# Patient Record
Sex: Male | Born: 1970 | Race: White | Hispanic: No | Marital: Married | Smoking: Never smoker
Health system: Southern US, Community
[De-identification: ages and names within clinical notes are randomized; demographics above are authoritative.]

## PROBLEM LIST (undated history)

## (undated) DIAGNOSIS — T7840XA Allergy, unspecified, initial encounter: Secondary | ICD-10-CM

## (undated) DIAGNOSIS — M5126 Other intervertebral disc displacement, lumbar region: Secondary | ICD-10-CM

## (undated) DIAGNOSIS — M51369 Other intervertebral disc degeneration, lumbar region without mention of lumbar back pain or lower extremity pain: Secondary | ICD-10-CM

## (undated) DIAGNOSIS — E785 Hyperlipidemia, unspecified: Secondary | ICD-10-CM

## (undated) DIAGNOSIS — K5790 Diverticulosis of intestine, part unspecified, without perforation or abscess without bleeding: Secondary | ICD-10-CM

## (undated) DIAGNOSIS — K631 Perforation of intestine (nontraumatic): Secondary | ICD-10-CM

## (undated) DIAGNOSIS — M5136 Other intervertebral disc degeneration, lumbar region: Secondary | ICD-10-CM

## (undated) DIAGNOSIS — K219 Gastro-esophageal reflux disease without esophagitis: Secondary | ICD-10-CM

## (undated) DIAGNOSIS — K529 Noninfective gastroenteritis and colitis, unspecified: Secondary | ICD-10-CM

## (undated) DIAGNOSIS — I10 Essential (primary) hypertension: Secondary | ICD-10-CM

## (undated) DIAGNOSIS — I493 Ventricular premature depolarization: Secondary | ICD-10-CM

## (undated) DIAGNOSIS — IMO0001 Reserved for inherently not codable concepts without codable children: Secondary | ICD-10-CM

## (undated) HISTORY — DX: Ventricular premature depolarization: I49.3

## (undated) HISTORY — DX: Allergy, unspecified, initial encounter: T78.40XA

## (undated) HISTORY — PX: COLONOSCOPY: SHX174

## (undated) HISTORY — DX: Perforation of intestine (nontraumatic): K63.1

## (undated) HISTORY — PX: WISDOM TOOTH EXTRACTION: SHX21

## (undated) HISTORY — DX: Diverticulosis of intestine, part unspecified, without perforation or abscess without bleeding: K57.90

## (undated) HISTORY — DX: Reserved for inherently not codable concepts without codable children: IMO0001

## (undated) HISTORY — DX: Noninfective gastroenteritis and colitis, unspecified: K52.9

## (undated) HISTORY — PX: MANDIBLE FRACTURE SURGERY: SHX706

## (undated) HISTORY — PX: LAPAROSCOPIC ILEOCECECTOMY: SHX5898

## (undated) HISTORY — DX: Gastro-esophageal reflux disease without esophagitis: K21.9

---

## 2015-08-12 ENCOUNTER — Encounter: Payer: Self-pay | Admitting: Physician Assistant

## 2015-08-12 ENCOUNTER — Ambulatory Visit: Payer: Self-pay | Admitting: Family

## 2015-08-12 VITALS — BP 144/110 | Temp 98.3°F

## 2015-08-12 DIAGNOSIS — M5440 Lumbago with sciatica, unspecified side: Secondary | ICD-10-CM

## 2015-08-12 MED ORDER — TRAMADOL HCL 50 MG PO TABS: 50.0000 mg | ORAL_TABLET | Freq: Three times a day (TID) | ORAL | Status: DC | PRN

## 2015-08-12 MED ORDER — PREDNISONE 10 MG (21) PO TBPK: 10.0000 mg | ORAL_TABLET | Freq: Three times a day (TID) | ORAL | Status: AC

## 2015-08-12 NOTE — Progress Notes (Signed)
S /43 y/o WM with prior h/o  disk issues in lumbar area , treated conservatively  And he has been asx until a few days ago he was unloading bails of mulch and with twisting motion noted acute pain  With radiation in to his left buttock and upper thigh . He denies B& B sxs no weakness or numbness. He has continued to work   O/ BP is elevated . He states it usually runs 110 / 70 and feels it is due to his pain.       Alert ,pleasant NAD   gait normal  Back full ROM , nontender to palpation, strength and reflexes are normal SLR neg  A/ LBP with sciatica  HTN    P / Instructed to hold taking voltaren and rx given to  begin Sterapred pack as directed x 12 days. Marland Kitchen He may use flexeril he has on hand, topical heat or ice .rx given for tramadol 50 mg one q 8 hours prn #20 0 rf.  He will monitor his BP and has a f/u appt with his PCP next week. Marland Kitchen

## 2015-10-05 ENCOUNTER — Encounter: Payer: Self-pay | Admitting: Physician Assistant

## 2015-10-05 ENCOUNTER — Ambulatory Visit: Payer: Self-pay | Admitting: Physician Assistant

## 2015-10-05 VITALS — BP 138/100 | HR 116 | Temp 98.0°F

## 2015-10-05 DIAGNOSIS — J069 Acute upper respiratory infection, unspecified: Secondary | ICD-10-CM

## 2015-10-05 MED ORDER — CEFDINIR 300 MG PO CAPS: 300.0000 mg | ORAL_CAPSULE | Freq: Two times a day (BID) | ORAL | Status: DC

## 2015-10-05 MED ORDER — BENZONATATE 200 MG PO CAPS: 200.0000 mg | ORAL_CAPSULE | Freq: Three times a day (TID) | ORAL | Status: DC | PRN

## 2015-10-05 NOTE — Progress Notes (Signed)
S: C/o runny nose and congestion for 3 days, no fever, chills, cp/sob, v/d; mucus was green this am but clear throughout the day, cough is sporadic, keeping him awake at night  Using otc meds: robitussin  O: PE: perrl eomi, normocephalic, tms dull, nasal mucosa red and swollen, throat injected, neck supple no lymph, lungs c t a, cv rrr, neuro intact  A:  Acute  uri   P:omnicef 300mg  bid, tessalon perls, phenergan dm with codeine 5ml q 6 prn cough, (pt to use only at night) 150ml nr;  drink fluids, continue regular meds , use otc meds of choice, return if not improving in 5 days, return earlier if worsening

## 2015-10-10 ENCOUNTER — Observation Stay (HOSPITAL_COMMUNITY)
Admission: EM | Admit: 2015-10-10 | Discharge: 2015-10-11 | Disposition: A | Discharge status: Home / Self Care | Payer: BLUE CROSS/BLUE SHIELD | Attending: Internal Medicine | Admitting: Internal Medicine

## 2015-10-10 ENCOUNTER — Emergency Department (HOSPITAL_COMMUNITY): Payer: BLUE CROSS/BLUE SHIELD

## 2015-10-10 ENCOUNTER — Encounter (HOSPITAL_COMMUNITY): Payer: Self-pay | Admitting: *Deleted

## 2015-10-10 DIAGNOSIS — K219 Gastro-esophageal reflux disease without esophagitis: Secondary | ICD-10-CM | POA: Insufficient documentation

## 2015-10-10 DIAGNOSIS — R002 Palpitations: Principal | ICD-10-CM | POA: Insufficient documentation

## 2015-10-10 DIAGNOSIS — Z880 Allergy status to penicillin: Secondary | ICD-10-CM | POA: Insufficient documentation

## 2015-10-10 DIAGNOSIS — M5126 Other intervertebral disc displacement, lumbar region: Secondary | ICD-10-CM | POA: Diagnosis not present

## 2015-10-10 DIAGNOSIS — E785 Hyperlipidemia, unspecified: Secondary | ICD-10-CM | POA: Diagnosis not present

## 2015-10-10 DIAGNOSIS — Z791 Long term (current) use of non-steroidal anti-inflammatories (NSAID): Secondary | ICD-10-CM | POA: Diagnosis not present

## 2015-10-10 DIAGNOSIS — I10 Essential (primary) hypertension: Secondary | ICD-10-CM | POA: Diagnosis not present

## 2015-10-10 DIAGNOSIS — E876 Hypokalemia: Secondary | ICD-10-CM | POA: Insufficient documentation

## 2015-10-10 DIAGNOSIS — Z79899 Other long term (current) drug therapy: Secondary | ICD-10-CM | POA: Diagnosis not present

## 2015-10-10 DIAGNOSIS — A084 Viral intestinal infection, unspecified: Secondary | ICD-10-CM | POA: Diagnosis present

## 2015-10-10 DIAGNOSIS — E860 Dehydration: Secondary | ICD-10-CM | POA: Diagnosis present

## 2015-10-10 DIAGNOSIS — R000 Tachycardia, unspecified: Secondary | ICD-10-CM | POA: Diagnosis present

## 2015-10-10 HISTORY — DX: Hyperlipidemia, unspecified: E78.5

## 2015-10-10 HISTORY — DX: Other intervertebral disc degeneration, lumbar region without mention of lumbar back pain or lower extremity pain: M51.369

## 2015-10-10 HISTORY — DX: Other intervertebral disc displacement, lumbar region: M51.26

## 2015-10-10 HISTORY — DX: Other intervertebral disc degeneration, lumbar region: M51.36

## 2015-10-10 HISTORY — DX: Essential (primary) hypertension: I10

## 2015-10-10 LAB — COMPREHENSIVE METABOLIC PANEL
ALT: 36 U/L (ref 17–63)
AST: 26 U/L (ref 15–41)
Albumin: 3.9 g/dL (ref 3.5–5.0)
Alkaline Phosphatase: 64 U/L (ref 38–126)
Anion gap: 8 (ref 5–15)
BILIRUBIN TOTAL: 0.5 mg/dL (ref 0.3–1.2)
BUN: 13 mg/dL (ref 6–20)
CALCIUM: 9.7 mg/dL (ref 8.9–10.3)
CO2: 27 mmol/L (ref 22–32)
Chloride: 107 mmol/L (ref 101–111)
Creatinine, Ser: 0.86 mg/dL (ref 0.61–1.24)
GFR calc Af Amer: >60 mL/min (ref >60)
Glucose, Bld: 109 mg/dL — ABNORMAL HIGH (ref 65–99)
POTASSIUM: 3.4 mmol/L — AB (ref 3.5–5.1)
SODIUM: 142 mmol/L (ref 135–145)
TOTAL PROTEIN: 7 g/dL (ref 6.5–8.1)

## 2015-10-10 LAB — CBC WITH DIFFERENTIAL/PLATELET
BASOS PCT: 0 %
Basophils Absolute: 0 10*3/uL (ref 0.0–0.1)
EOS ABS: 0.3 10*3/uL (ref 0.0–0.7)
EOS PCT: 3 %
HCT: 41.5 % (ref 39.0–52.0)
HEMOGLOBIN: 14.6 g/dL (ref 13.0–17.0)
Lymphocytes Relative: 26 %
Lymphs Abs: 2.5 10*3/uL (ref 0.7–4.0)
MCH: 29.1 pg (ref 26.0–34.0)
MCHC: 35.2 g/dL (ref 30.0–36.0)
MCV: 82.7 fL (ref 78.0–100.0)
MONO ABS: 0.5 10*3/uL (ref 0.1–1.0)
MONOS PCT: 5 %
NEUTROS PCT: 66 %
Neutro Abs: 6.5 10*3/uL (ref 1.7–7.7)
PLATELETS: 348 10*3/uL (ref 150–400)
RBC: 5.02 MIL/uL (ref 4.22–5.81)
RDW: 13.4 % (ref 11.5–15.5)
WBC: 9.8 10*3/uL (ref 4.0–10.5)

## 2015-10-10 LAB — TROPONIN I

## 2015-10-10 LAB — D-DIMER, QUANTITATIVE: D-Dimer, Quant: 0.44 ug/mL-FEU (ref 0.00–0.50)

## 2015-10-10 LAB — MAGNESIUM: MAGNESIUM: 1.6 mg/dL — AB (ref 1.7–2.4)

## 2015-10-10 LAB — LIPASE, BLOOD: LIPASE: 26 U/L (ref 11–51)

## 2015-10-10 MED ORDER — POTASSIUM CHLORIDE CRYS ER 20 MEQ PO TBCR
40.0000 meq | EXTENDED_RELEASE_TABLET | Freq: Once | ORAL | Status: AC
Start: 2015-10-10 — End: 2015-10-10
  Administered 2015-10-10: 40 meq via ORAL
  Filled 2015-10-10: qty 2

## 2015-10-10 MED ORDER — SODIUM CHLORIDE 0.9 % IV BOLUS (SEPSIS)
1000.0000 mL | Freq: Once | INTRAVENOUS | Status: AC
  Administered 2015-10-10: 1000 mL via INTRAVENOUS

## 2015-10-10 MED ORDER — MAGNESIUM SULFATE 2 GM/50ML IV SOLN
2.0000 g | Freq: Once | INTRAVENOUS | Status: AC
  Administered 2015-10-10: 2 g via INTRAVENOUS
  Filled 2015-10-10: qty 50

## 2015-10-10 MED ORDER — SODIUM CHLORIDE 0.9 % IV SOLN
INTRAVENOUS | Status: DC
  Administered 2015-10-10: 20:00:00 via INTRAVENOUS

## 2015-10-10 NOTE — ED Provider Notes (Signed)
CSN: [id number]     Arrival date & time 10/10/15  1938 History   First MD Initiated Contact with Patient 10/10/15 1940     Chief Complaint  Patient presents with  . Tachycardia      HPI Pt was seen at 1950. Per pt, c/o gradual onset and persistence of constant palpitations that began earlier today. Pt describes the sensation as "my heart is flipping" and "fast HR." HR at home into 120-130's. Pt endorses cough, as well as multiple intermittent episodes of N/V/D for the past week. Has been taking tessalon, as well as codeine/phenergan cough syrup for his symptoms. States entire household has similar symptoms (cough, N/V/D). Pt also states he has been experiencing intermittent palpitations for the past several months. Denies CP/SOB, no abd pain, no black or blood in stools, no fevers.    Past Medical History  Diagnosis Date  . Hypertension   . Reflux   . Bulging lumbar disc   . Hyperlipidemia    Past Surgical History  Procedure Laterality Date  . Colon surgery      Social History  Substance Use Topics  . Smoking status: Never Smoker   . Smokeless tobacco: None  . Alcohol Use: No    Review of Systems ROS: Statement: All systems negative except as marked or noted in the HPI; Constitutional: Negative for fever and chills. ; ; Eyes: Negative for eye pain, redness and discharge. ; ; ENMT: Negative for ear pain, hoarseness, nasal congestion, sinus pressure and sore throat. ; ; Cardiovascular: +palpitations. Negative for chest pain, diaphoresis, dyspnea and peripheral edema. ; ; Respiratory: +cough. Negative for wheezing and stridor. ; ; Gastrointestinal: +N/V/D. Negative for abdominal pain, blood in stool, hematemesis, jaundice and rectal bleeding. . ; ; Genitourinary: Negative for dysuria, flank pain and hematuria. ; ; Musculoskeletal: Negative for back pain and neck pain. Negative for swelling and trauma.; ; Skin: Negative for pruritus, rash, abrasions, blisters, bruising and skin  lesion.; ; Neuro: Negative for headache, lightheadedness and neck stiffness. Negative for weakness, altered level of consciousness , altered mental status, extremity weakness, paresthesias, involuntary movement, seizure and syncope.      Allergies  Clarithromycin and Penicillins  Home Medications   Prior to Admission medications   Medication Sig Start Date End Date Taking? Authorizing Provider  benzonatate (TESSALON) 200 MG capsule Take 1 capsule (200 mg total) by mouth 3 (three) times daily as needed for cough. 10/05/15  Yes Faythe Ghee, PA-C  cefdinir (OMNICEF) 300 MG capsule Take 1 capsule (300 mg total) by mouth 2 (two) times daily. 10/05/15  Yes Faythe Ghee, PA-C  cholecalciferol (VITAMIN D) 1000 UNITS tablet Take 1,000 Units by mouth daily.   Yes Historical Provider, MD  Choline Fenofibrate (FENOFIBRIC ACID) 135 MG CPDR TAKE ONE CAPSULE BY MOUTH ONCE A DAY 09/03/15  Yes Historical Provider, MD  diclofenac (VOLTAREN) 75 MG EC tablet Take 75 mg by mouth 2 (two) times daily.   Yes Historical Provider, MD  fenofibrate (TRICOR) 145 MG tablet Take 145 mg by mouth daily.   Yes Historical Provider, MD  hydrochlorothiazide (HYDRODIURIL) 25 MG tablet Take 25 mg by mouth daily.   Yes Historical Provider, MD  Lactobacillus Rhamnosus, GG, (CULTURELLE PO) Take 1 capsule by mouth daily.   Yes Historical Provider, MD  loratadine (CLARITIN) 10 MG tablet Take 10 mg by mouth daily.   Yes Historical Provider, MD  omeprazole (PRILOSEC) 20 MG capsule Take 20 mg by mouth daily.   Yes Historical  Provider, MD  promethazine-codeine (PHENERGAN WITH CODEINE) 6.25-10 MG/5ML syrup TAKE EVERY 6 HRS AS NEEDED FOR COUGH 10/05/15  Yes Historical Provider, MD  traMADol (ULTRAM) 50 MG tablet Take 1 tablet (50 mg total) by mouth every 8 (eight) hours as needed. 08/12/15  Yes Tommie Maximiano Coss, FNP   BP 147/74 mmHg  Pulse 110  Resp 15  Ht  (1.803 m)  Wt 275 lb (124.739 kg)  BMI 38.37 kg/m2  SpO2 99%    Filed Vitals:   10/10/15 1948 10/10/15 2018 10/10/15 2030  BP: 159/100  147/74  Pulse: 126  110  Resp: 20  15  Height:  (1.803 m)    Weight: 275 lb (124.739 kg)    SpO2: 99% 99% 99%    Physical Exam  1955: Physical examination:  Nursing notes reviewed; Vital signs and O2 SAT reviewed;  Constitutional: Well developed, Well nourished, In no acute distress; Head:  Normocephalic, atraumatic; Eyes: EOMI, PERRL, No scleral icterus; ENMT: Mouth and pharynx normal, Mucous membranes dry; Neck: Supple, Full range of motion, No lymphadenopathy; Cardiovascular: Tachycardic rate and rhythm, No gallop; Respiratory: Breath sounds clear & equal bilaterally, No wheezes.  Speaking full sentences with ease, Normal respiratory effort/excursion; Chest: Nontender, Movement normal; Abdomen: Soft, Nontender, Nondistended, Normal bowel sounds; Genitourinary: No CVA tenderness; Extremities: Pulses normal, No tenderness, No edema, No calf edema or asymmetry.; Neuro: AA&Ox3, Major CN grossly intact.  Speech clear. No gross focal motor or sensory deficits in extremities.; Skin: Color normal, Warm, Dry.   ED Course  Procedures (including critical care time) Labs Review   Imaging Review  I have personally reviewed and evaluated these images and lab results as part of my medical decision-making.   EKG Interpretation   Date/Time:  Sunday October 10 2015 19:50:20 EST Ventricular Rate:  122 PR Interval:  154 QRS Duration: 70 QT Interval:  357 QTC Calculation: 509 R Axis:   71 Text Interpretation:  Sinus tachycardia Multiple ventricular premature  complexes Low voltage, precordial leads Probable anteroseptal infarct, old  Borderline T abnormalities, inferior leads Prolonged QT interval No old  tracing to compare Confirmed by [facility]  MD, Nicholos Johns [phone]) on  10/10/2015 8:00:18 PM      EKG Interpretation  Date/Time:  Sunday October 10 2015 21:34:18 EST Ventricular Rate:  100 PR  Interval:  158 QRS Duration: 72 QT Interval:  334 QTC Calculation: 431 R Axis:   69 Text Interpretation:  Sinus tachycardia Multiform ventricular premature complexes Low voltage, precordial leads Since last tracing of earlier today Rate slower Confirmed by [facility]  MD, Nicholos Johns [phone]) on 10/10/2015 9:41:32 PM        MDM  MDM Reviewed: previous chart, nursing note and vitals Reviewed previous: labs and ECG Interpretation: labs, ECG and x-ray     Results for orders placed or performed during the hospital encounter of 10/10/15  Troponin I  Result Value Ref Range   Troponin I <0.03 <0.031 ng/mL  D-dimer, quantitative  Result Value Ref Range   D-Dimer, Quant 0.44 0.00 - 0.50 ug/mL-FEU  CBC with Differential  Result Value Ref Range   WBC 9.8 4.0 - 10.5 K/uL   RBC 5.02 4.22 - 5.81 MIL/uL   Hemoglobin 14.6 13.0 - 17.0 g/dL   HCT 09.8 11.9 - 14.7 %   MCV 82.7 78.0 - 100.0 fL   MCH 29.1 26.0 - 34.0 pg   MCHC 35.2 30.0 - 36.0 g/dL   RDW 82.9 56.2 - 13.0 %   Platelets  348 150 - 400 K/uL   Neutrophils Relative % 66 %   Neutro Abs 6.5 1.7 - 7.7 K/uL   Lymphocytes Relative 26 %   Lymphs Abs 2.5 0.7 - 4.0 K/uL   Monocytes Relative 5 %   Monocytes Absolute 0.5 0.1 - 1.0 K/uL   Eosinophils Relative 3 %   Eosinophils Absolute 0.3 0.0 - 0.7 K/uL   Basophils Relative 0 %   Basophils Absolute 0.0 0.0 - 0.1 K/uL  Magnesium  Result Value Ref Range   Magnesium 1.6 (L) 1.7 - 2.4 mg/dL  Comprehensive metabolic panel  Result Value Ref Range   Sodium 142 135 - 145 mmol/L   Potassium 3.4 (L) 3.5 - 5.1 mmol/L   Chloride 107 101 - 111 mmol/L   CO2 27 22 - 32 mmol/L   Glucose, Bld 109 (H) 65 - 99 mg/dL   BUN 13 6 - 20 mg/dL   Creatinine, Ser 1.610.86 0.61 - 1.24 mg/dL   Calcium 9.7 8.9 - 09.610.3 mg/dL   Total Protein 7.0 6.5 - 8.1 g/dL   Albumin 3.9 3.5 - 5.0 g/dL   AST 26 15 - 41 U/L   ALT 36 17 - 63 U/L   Alkaline Phosphatase 64 38 - 126 U/L   Total Bilirubin 0.5 0.3 - 1.2 mg/dL   GFR  calc non Af Amer >60 >60 mL/min   GFR calc Af Amer >60 >60 mL/min   Anion gap 8 5 - 15  Lipase, blood  Result Value Ref Range   Lipase 26 11 - 51 U/L   Dg Chest Port 1 View 10/10/2015  CLINICAL DATA:  Acute onset of tachycardia and diaphoresis. Initial encounter. EXAM: PORTABLE CHEST 1 VIEW COMPARISON:  None. FINDINGS: The lungs are well-aerated. There is elevation of the right hemidiaphragm. Pulmonary vascularity is at the upper limits of normal. There is no evidence of focal opacification, pleural effusion or pneumothorax. The cardiomediastinal silhouette is within normal limits. No acute osseous abnormalities are seen. IMPRESSION: Elevation of the right hemidiaphragm.  Lungs remain grossly clear. Electronically Signed   By: Roanna RaiderJeffery  Chang M.D.   On: 10/10/2015 20:36    2135:  HR improving with IVF; will continue. Potassium repleted PO. IV magnesium started, then stopped after approximately 1gm infused due to frequent PVC's and HR briefly dipping to 50's before returning to 100's. Pt does state he feels "much better now." Has tol PO well without N/V. No stooling while in the ED. Abd remains benign, resps easy.  Will continue IVF.   2320:  Monitor with NSR, rates 100's then 70's, frequent PVC's. While updating pt regarding his labs, monitor with frequent P-waves with dropped QRS complexes as well as frequent PVC's; followed several times with HR dropping to 40's and appearing 3rd degree AVB; unable to capture on 12-lead.  Pt stated he was feeling intermittently "flushed" during these episodes. [facility] Donnie Ahoilley, case discussed, including:  HPI, pertinent PM/SHx, VS/PE, dx testing, ED course and treatment:  Agrees with ED treatment, pt's symptoms likely due to dehydration, electrolyte disturbance, and high vagal tone (due to younger age); OK [facility] for continued [facility] hydration, Cards consult in morning. T/C to [facility] Dr. Lovell SheehanJenkins, case discussed, including:  HPI, pertinent PM/SHx, VS/PE,  dx testing, ED course and treatment:  Agreeable to admit, requests to write temporary orders, obtain observation tele bed to team APAdmits.   Samuel JesterKathleen Dorice Stiggers, DO 10/12/15 1947

## 2015-10-10 NOTE — ED Notes (Signed)
Pt c/o tachycardia, diaphoresis, and the feeling that his heart is "flipping."

## 2015-10-11 ENCOUNTER — Observation Stay (HOSPITAL_BASED_OUTPATIENT_CLINIC_OR_DEPARTMENT_OTHER): Payer: BLUE CROSS/BLUE SHIELD

## 2015-10-11 ENCOUNTER — Encounter (HOSPITAL_COMMUNITY): Payer: Self-pay | Admitting: Emergency Medicine

## 2015-10-11 DIAGNOSIS — E876 Hypokalemia: Secondary | ICD-10-CM | POA: Diagnosis present

## 2015-10-11 DIAGNOSIS — E860 Dehydration: Secondary | ICD-10-CM | POA: Diagnosis not present

## 2015-10-11 DIAGNOSIS — R002 Palpitations: Secondary | ICD-10-CM | POA: Diagnosis not present

## 2015-10-11 DIAGNOSIS — A084 Viral intestinal infection, unspecified: Secondary | ICD-10-CM

## 2015-10-11 DIAGNOSIS — I10 Essential (primary) hypertension: Secondary | ICD-10-CM | POA: Diagnosis not present

## 2015-10-11 DIAGNOSIS — R000 Tachycardia, unspecified: Secondary | ICD-10-CM

## 2015-10-11 LAB — CBC
HEMATOCRIT: 38.4 % — AB (ref 39.0–52.0)
HEMOGLOBIN: 13.1 g/dL (ref 13.0–17.0)
MCH: 28.3 pg (ref 26.0–34.0)
MCHC: 34.1 g/dL (ref 30.0–36.0)
MCV: 82.9 fL (ref 78.0–100.0)
Platelets: 334 10*3/uL (ref 150–400)
RBC: 4.63 MIL/uL (ref 4.22–5.81)
RDW: 13.4 % (ref 11.5–15.5)
WBC: 8.5 10*3/uL (ref 4.0–10.5)

## 2015-10-11 LAB — RAPID URINE DRUG SCREEN, HOSP PERFORMED
AMPHETAMINES: NOT DETECTED
BENZODIAZEPINES: NOT DETECTED
Barbiturates: NOT DETECTED
Cocaine: NOT DETECTED
OPIATES: NOT DETECTED
Tetrahydrocannabinol: NOT DETECTED

## 2015-10-11 LAB — TROPONIN I
Troponin I: <0.03 ng/mL (ref <0.031)
Troponin I: <0.03 ng/mL (ref <0.031)

## 2015-10-11 LAB — MAGNESIUM: MAGNESIUM: 1.7 mg/dL (ref 1.7–2.4)

## 2015-10-11 LAB — BASIC METABOLIC PANEL
ANION GAP: 6 (ref 5–15)
BUN: 8 mg/dL (ref 6–20)
CALCIUM: 8.5 mg/dL — AB (ref 8.9–10.3)
CHLORIDE: 111 mmol/L (ref 101–111)
CO2: 24 mmol/L (ref 22–32)
Creatinine, Ser: 0.79 mg/dL (ref 0.61–1.24)
GFR calc non Af Amer: >60 mL/min (ref >60)
Glucose, Bld: 101 mg/dL — ABNORMAL HIGH (ref 65–99)
POTASSIUM: 3.7 mmol/L (ref 3.5–5.1)
Sodium: 141 mmol/L (ref 135–145)

## 2015-10-11 LAB — TSH: TSH: 0.715 u[IU]/mL (ref 0.350–4.500)

## 2015-10-11 MED ORDER — HYDROMORPHONE HCL 1 MG/ML IJ SOLN: 0.5000 mg | INTRAMUSCULAR | Status: DC | PRN

## 2015-10-11 MED ORDER — ACETAMINOPHEN 650 MG RE SUPP: 650.0000 mg | Freq: Four times a day (QID) | RECTAL | Status: DC | PRN

## 2015-10-11 MED ORDER — SODIUM CHLORIDE 0.9 % IV SOLN
INTRAVENOUS | Status: DC
  Administered 2015-10-11 (×2): via INTRAVENOUS

## 2015-10-11 MED ORDER — ACETAMINOPHEN 325 MG PO TABS
650.0000 mg | ORAL_TABLET | Freq: Four times a day (QID) | ORAL | Status: DC | PRN
  Administered 2015-10-11: 650 mg via ORAL
  Filled 2015-10-11: qty 2

## 2015-10-11 MED ORDER — ONDANSETRON HCL 4 MG/2ML IJ SOLN: 4.0000 mg | Freq: Four times a day (QID) | INTRAMUSCULAR | Status: DC | PRN

## 2015-10-11 MED ORDER — METOPROLOL TARTRATE 25 MG PO TABS: 12.5000 mg | ORAL_TABLET | Freq: Two times a day (BID) | ORAL | Status: DC

## 2015-10-11 MED ORDER — PANTOPRAZOLE SODIUM 40 MG PO TBEC
40.0000 mg | DELAYED_RELEASE_TABLET | Freq: Every day | ORAL | Status: DC
  Administered 2015-10-11: 40 mg via ORAL
  Filled 2015-10-11: qty 1

## 2015-10-11 MED ORDER — METOPROLOL TARTRATE 25 MG PO TABS
12.5000 mg | ORAL_TABLET | Freq: Two times a day (BID) | ORAL | Status: DC
Start: 2015-10-11 — End: 2015-10-11
  Administered 2015-10-11: 12.5 mg via ORAL
  Filled 2015-10-11: qty 1

## 2015-10-11 MED ORDER — ONDANSETRON HCL 4 MG PO TABS: 4.0000 mg | ORAL_TABLET | Freq: Four times a day (QID) | ORAL | Status: DC | PRN

## 2015-10-11 MED ORDER — HYDRALAZINE HCL 20 MG/ML IJ SOLN: 5.0000 mg | Freq: Four times a day (QID) | INTRAMUSCULAR | Status: DC | PRN

## 2015-10-11 MED ORDER — SODIUM CHLORIDE 0.9 % IJ SOLN: 3.0000 mL | Freq: Two times a day (BID) | INTRAMUSCULAR | Status: DC

## 2015-10-11 MED ORDER — LORATADINE 10 MG PO TABS
10.0000 mg | ORAL_TABLET | Freq: Every day | ORAL | Status: DC
  Administered 2015-10-11: 10 mg via ORAL
  Filled 2015-10-11: qty 1

## 2015-10-11 MED ORDER — FENOFIBRATE 160 MG PO TABS
160.0000 mg | ORAL_TABLET | Freq: Every day | ORAL | Status: DC
  Administered 2015-10-11: 160 mg via ORAL

## 2015-10-11 MED ORDER — ALUM & MAG HYDROXIDE-SIMETH 200-200-20 MG/5ML PO SUSP: 30.0000 mL | Freq: Four times a day (QID) | ORAL | Status: DC | PRN

## 2015-10-11 MED ORDER — OXYCODONE HCL 5 MG PO TABS: 5.0000 mg | ORAL_TABLET | ORAL | Status: DC | PRN

## 2015-10-11 MED ORDER — ENOXAPARIN SODIUM 40 MG/0.4ML ~~LOC~~ SOLN
40.0000 mg | SUBCUTANEOUS | Status: DC
  Administered 2015-10-11: 40 mg via SUBCUTANEOUS
  Filled 2015-10-11: qty 0.4

## 2015-10-11 MED ORDER — VITAMIN D 1000 UNITS PO TABS
1000.0000 [IU] | ORAL_TABLET | Freq: Every day | ORAL | Status: DC
  Administered 2015-10-11: 1000 [IU] via ORAL
  Filled 2015-10-11: qty 1

## 2015-10-11 MED ORDER — POTASSIUM CHLORIDE 10 MEQ/100ML IV SOLN
10.0000 meq | INTRAVENOUS | Status: AC
  Administered 2015-10-11 (×2): 10 meq via INTRAVENOUS
  Filled 2015-10-11 (×2): qty 100

## 2015-10-11 MED ORDER — RISAQUAD PO CAPS
1.0000 | ORAL_CAPSULE | Freq: Every day | ORAL | Status: DC
  Administered 2015-10-11: 1 via ORAL
  Filled 2015-10-11: qty 1

## 2015-10-11 NOTE — Care Management Note (Signed)
Case Management Note  Patient Details  Name: Jeffrey Morales MRN: [id number] Date of Birth: 04-07-1971  Subjective/Objective:                  Pt admitted from home with palpitations. Pt live with his wife and will return home at discharge. Pt is independent with ADL's.  Action/Plan: Anticipate discharge today. No CM needs noted.  Expected Discharge Date:                  Expected Discharge Plan:  Home/Self Care  In-House Referral:  NA  Discharge planning Services  CM Consult  Post Acute Care Choice:  NA Choice offered to:  NA  DME Arranged:    DME Agency:     HH Arranged:    HH Agency:     Status of Service:  Completed, signed off  Medicare Important Message Given:    Date Medicare IM Given:    Medicare IM give by:    Date Additional Medicare IM Given:    Additional Medicare Important Message give by:     If discussed at Long Length of Stay Meetings, dates discussed:    Additional Comments:  Cheryl FlashBlackwell, Amaziah Ghosh Crowder, RN 10/11/2015, 2:45 PM

## 2015-10-11 NOTE — Discharge Summary (Signed)
Physician Discharge Summary  Jeffrey GreaserGregory Morales NWG:[id number] DOB: [birth year] DOA: 10/10/2015  PCP: No primary care provider on file.  Admit date: 10/10/2015 Discharge date: 10/11/2015  Time spent: 20 minutes  Recommendations for Outpatient Follow-up:  1. Discharge home with outpatient PCP and cardiac etiology follow-up   Discharge Diagnoses:  Principal Problem:   Palpitations   Active Problems:   Hypokalemia   Hypomagnesemia   Viral gastroenteritis   Hypertension   Dehydration   Discharge Condition: fair  Diet recommendation:2 gm sodium  Filed Weights   10/10/15 1948 10/11/15 0206  Weight: 124.739 kg (275 lb) 128.096 kg (282 lb 6.4 oz)    History of present illness:  44 year old male with history of hypertension, hyperlipidemia, chronic low back pain presented to the ED with almost 1 week history of palpitations off and on. He reports intermittent palpitations for past several months but not this severe and would resolve on its own. Reports having URI recently and took over-the-counter decongestants and his PCP placed him on an antibiotic course of Omnicef and antitussives. He also reports having multiple bouts of diarrhea last 3-4 days after possibly requiring a GI virus from his children. Patient reports on the day of admission his heart rate wrist up to 130s. Denies any chest pain, shortness of breath or diaphoresis. Denies any weight loss,, nausea or vomiting. In the ED he had low k and mg was in sinus tachycardia. Troponin was negative. Patient admitted to hospital service under observation.   Hospital Course:  Palpitations Possibly associated with dehydration. Patient placed on IV fluids and a left right replenished. Heart rate improved to 90s. Patient asymptomatic. Serial troponins were negative. TSH normal. 2-D echo showed normal EF and no wall motion abnormality with mild tricuspid regurgitation. Seen by cardiology who recommended stopping his HCTZ and placing him  on low-dose beta blocker. Patient is stable and can be discharged home. Cardiology [facility]. Will consider outpatient cardiac monitor during follow-up.  Essential hypertension Discontinued HCTZ and switch to metoprolol.  Hypokalemia/hypomagnesemia Secondary to dehydration. Replenished.     Family communication: At bedside   Disposition: Home   Procedures:  2-D echo  Consultations:  Cardiology  Discharge Exam: Filed Vitals:   10/11/15 0208 10/11/15 0606  BP:  149/99  Pulse: 114 102  Temp:  97.8 F (36.6 C)  Resp:  20    General: Middle aged male not in distress HEENT: No pallor, moist oral mucosa Chest: Clear to auscultation bilaterally CVS: S1 and S2 tachycardic, no murmurs rubs or gallop GI: Soft, nondistended, nontender, bowel sounds present  musculoskeletal: Warm, no edema    Discharge Instructions    Current Discharge Medication List    START taking these medications   Details  metoprolol tartrate (LOPRESSOR) 25 MG tablet Take 0.5 tablets (12.5 mg total) by mouth 2 (two) times daily. Qty: 60 tablet, Refills: 0      CONTINUE these medications which have NOT CHANGED   Details  cefdinir (OMNICEF) 300 MG capsule Take 1 capsule (300 mg total) by mouth 2 (two) times daily. Qty: 20 capsule, Refills: 0   Associated Diagnoses: Acute upper respiratory infection    cholecalciferol (VITAMIN D) 1000 UNITS tablet Take 1,000 Units by mouth daily.    Choline Fenofibrate (FENOFIBRIC ACID) 135 MG CPDR TAKE ONE CAPSULE BY MOUTH ONCE A DAY Refills: 1    diclofenac (VOLTAREN) 75 MG EC tablet Take 75 mg by mouth 2 (two) times daily.    fenofibrate (TRICOR)  145 MG tablet Take 145 mg by mouth daily.    Lactobacillus Rhamnosus, GG, (CULTURELLE PO) Take 1 capsule by mouth daily.    loratadine (CLARITIN) 10 MG tablet Take 10 mg by mouth daily.    omeprazole (PRILOSEC) 20 MG capsule Take 20 mg by mouth daily.     traMADol (ULTRAM) 50 MG tablet Take 1 tablet (50 mg total) by mouth every 8 (eight) hours as needed. Qty: 20 tablet, Refills: 0   Associated Diagnoses: Low back pain with sciatica, sciatica laterality unspecified, unspecified back pain laterality      STOP taking these medications     benzonatate (TESSALON) 200 MG capsule      hydrochlorothiazide (HYDRODIURIL) 25 MG tablet      promethazine-codeine (PHENERGAN WITH CODEINE) 6.25-10 MG/5ML syrup        Allergies  Allergen Reactions  . Clarithromycin Other (See Comments)    "out-of-body" experience  . Penicillins     Has patient had a PCN reaction causing immediate rash, facial/tongue/throat swelling, SOB or lightheadedness with hypotension: Yes Has patient had a PCN reaction causing severe rash involving mucus membranes or skin necrosis: No Has patient had a PCN reaction that required hospitalization No Has patient had a PCN reaction occurring within the last 10 years: No n If all of the above answers are "NO", then may proceed with Cephalosporin use.    Follow-up Information    Please follow up.   Why:  cardiology at [place]       The results of significant diagnostics from this hospitalization (including imaging, microbiology, ancillary and laboratory) are listed below for reference.    Significant Diagnostic Studies: Dg Chest Port 1 View  10/10/2015  CLINICAL DATA:  Acute onset of tachycardia and diaphoresis. Initial encounter. EXAM: PORTABLE CHEST 1 VIEW COMPARISON:  None. FINDINGS: The lungs are well-aerated. There is elevation of the right hemidiaphragm. Pulmonary vascularity is at the upper limits of normal. There is no evidence of focal opacification, pleural effusion or pneumothorax. The cardiomediastinal silhouette is within normal limits. No acute osseous abnormalities are seen. IMPRESSION: Elevation of the right hemidiaphragm.  Lungs remain grossly clear. Electronically Signed   By: Roanna Raider M.D.   On:  10/10/2015 20:36    Microbiology: No results found for this or any previous visit (from the past 240 hour(s)).   Labs: Basic Metabolic Panel:  Recent Labs Lab 10/10/15 2005 10/11/15 0745  NA 142 141  K 3.4* 3.7  CL 107 111  CO2 27 24  GLUCOSE 109* 101*  BUN 13 8  CREATININE 0.86 0.79  CALCIUM 9.7 8.5*  MG 1.6* 1.7   Liver Function Tests:  Recent Labs Lab 10/10/15 2005  AST 26  ALT 36  ALKPHOS 64  BILITOT 0.5  PROT 7.0  ALBUMIN 3.9    Recent Labs Lab 10/10/15 2005  LIPASE 26   No results for input(s): AMMONIA in the last 168 hours. CBC:  Recent Labs Lab 10/10/15 2005 10/11/15 0745  WBC 9.8 8.5  NEUTROABS 6.5  --   HGB 14.6 13.1  HCT 41.5 38.4*  MCV 82.7 82.9  PLT 348 334   Cardiac Enzymes:  Recent Labs Lab 10/10/15 2005 10/11/15 0028 10/11/15 0745 10/11/15 1404  TROPONINI <0.03 <0.03 <0.03 <0.03   BNP: BNP (last 3 results) No results for input(s): BNP in the last 8760 hours.  ProBNP (last 3 results) No results for input(s): PROBNP in the last 8760 hours.  CBG: No results for input(s): GLUCAP in the  last 168 hours.     SignedEddie North  [facility] 10/11/2015, 2:57 PM

## 2015-10-11 NOTE — Consult Note (Signed)
CARDIOLOGY CONSULT NOTE   Patient ID: Jeffrey Morales MRN: [id number] DOB/AGE: 1971/03/29 44 y.o.  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 He [place] in [place], [place] and has a history of hypertension for which he takes HCTZ. He presented to the [facility] with a feeling of palpitations described as a skipping, started within the last 24-48 hours. He has had a cold-like illness for the last 4-6 weeks associated with cough and chest congestion. He has taken OTC medications with intermittent improvement and was just recently prescribed Omnicef and Tessalon by [facility] [place].   On Thanksgiving day his son had a GI virus which spread through his family with emesis and diarrhea. He had these symptoms as well and it is mainly with these symptoms that his palpitations have been noticed.  He states that over the last six months he has noted that his heart rate has been generally elevated in the 90-100 range, sometimes in the 120s. He saw his primary care provider in [place] about this and had an ECG - reported without significant abnormalities. He has noticed some association with increased heart rate abd relative dehydration - states that if he drinks more water his heart rate will come down. He does not report any sudden onset or offset of increased heart rate and has had no syncope. He has even tried vagal maneuvers without effect.   In the ER he was found to be hypertensive with BP of 159/100, HR 126 bpm, afebrile. TSH 0.715, troponin I negative. Magnesium of 1.6 and potassium of 3.4. He was treated with IV fluids and given magnesium replacement and potassium replacement.  He did have an apparent vagal episode with this and heart rate recorded in 50s  transiently. He is feeling better this AM. Chest congestion and cough continue. HR remains elevated. He denies any chest pain.     Allergies  Allergen Reactions  . Clarithromycin Other (See Comments)    "out-of-body" experience  . Penicillins     Has patient had a PCN reaction causing immediate rash, facial/tongue/throat swelling, SOB or lightheadedness with hypotension: Yes Has patient had a PCN reaction causing severe rash involving mucus membranes or skin necrosis: No Has patient had a PCN reaction that required hospitalization No Has patient had a PCN reaction occurring within the last 10 years: No n If all of the above answers are "NO", then may proceed with Cephalosporin use.     Medications Scheduled Medications: . acidophilus  1 capsule Oral Daily  . cholecalciferol  1,000 Units Oral Daily  . enoxaparin (LOVENOX) injection  40 mg Subcutaneous Q24H  . fenofibrate  160 mg Oral Daily  . loratadine  10 mg Oral Daily  . metoprolol tartrate  12.5 mg Oral BID  . pantoprazole  40 mg Oral Daily  . sodium chloride  3 mL Intravenous Q12H    Infusions: . sodium chloride Stopped (10/10/15 2149)  . sodium chloride 100 mL/hr at 10/11/15 0428    PRN Medications: acetaminophen **OR** acetaminophen, alum & mag hydroxide-simeth, hydrALAZINE, HYDROmorphone (DILAUDID) injection, ondansetron **OR** ondansetron (ZOFRAN) IV, oxyCODONE   Past Medical History  Diagnosis Date  . Essential hypertension   . Reflux   . Bulging lumbar disc   . Hyperlipidemia     Past Surgical History  Procedure Laterality  Date  . Colon surgery    . Mandible fracture surgery      Family History  Problem Relation Age of Onset  . Hypertension Father     Social History Jeffrey Morales reports that he has never smoked. He does not have any smokeless tobacco history on file. Jeffrey Morales reports that he does not drink alcohol.  Review of Systems Complete review of systems are found to be negative unless  outlined in H&P above. No functional limitations or exertional chest pain.  Physical Examination Blood pressure 149/99, pulse 102, temperature 97.8 F (36.6 C), temperature source Oral, resp. rate 20, height  (1.803 m), weight 282 lb 6.4 oz (128.096 kg), SpO2 99 %. No intake or output data in the 24 hours ending 10/11/15 1005  Telemetry: Sinus rhythm with occasional PVCs and PACs.  GEN: Overweight male in no distress. HEENT: Conjunctiva and lids normal, oropharynx clear. Neck: Supple, no elevated JVP or carotid bruits, no thyromegaly. Lungs: Clear to auscultation, nonlabored breathing at rest. Cardiac: Regular rate and rhythm, no S3 or significant systolic murmur, no pericardial rub. Abdomen: Soft, nontender, bowel sounds present, no guarding or rebound. Extremities: No pitting edema, distal pulses 2+. Skin: Warm and dry. Musculoskeletal: No kyphosis. Neuropsychiatric: Alert and oriented x3, affect grossly appropriate.  Lab Results  Basic Metabolic Panel:  Recent Labs Lab 10/10/15 2005 10/11/15 0745  NA 142 141  K 3.4* 3.7  CL 107 111  CO2 27 24  GLUCOSE 109* 101*  BUN 13 8  CREATININE 0.86 0.79  CALCIUM 9.7 8.5*  MG 1.6* 1.7    Liver Function Tests:  Recent Labs Lab 10/10/15 2005  AST 26  ALT 36  ALKPHOS 64  BILITOT 0.5  PROT 7.0  ALBUMIN 3.9    CBC:  Recent Labs Lab 10/10/15 2005 10/11/15 0745  WBC 9.8 8.5  NEUTROABS 6.5  --   HGB 14.6 13.1  HCT 41.5 38.4*  MCV 82.7 82.9  PLT 348 334    Cardiac Enzymes:  Recent Labs Lab 10/10/15 2005 10/11/15 0028 10/11/15 0745  TROPONINI <0.03 <0.03 <0.03   Dg Chest Port 1 View  10/10/2015  CLINICAL DATA:  Acute onset of tachycardia and diaphoresis. Initial encounter. EXAM: PORTABLE CHEST 1 VIEW COMPARISON:  None. FINDINGS: The lungs are well-aerated. There is elevation of the right hemidiaphragm. Pulmonary vascularity is at the upper limits of normal. There is no evidence of focal opacification,  pleural effusion or pneumothorax. The cardiomediastinal silhouette is within normal limits. No acute osseous abnormalities are seen. IMPRESSION: Elevation of the right hemidiaphragm.  Lungs remain grossly clear. Electronically Signed   By: Roanna Raider M.D.   On: 10/10/2015 20:36    ECG: Sinus tachycardia PVCs and low voltage.   Impression and Recommendations  1. Recent palpitations: Reports general feeling of heart skipping, no chest pain or syncope. Symptoms mostly noted since apparent GI illness that started Thanksgiving. At baseline however he has noted elevated heart rates for the last 6 months as outlined above. Perhaps some association with relative dehydration - reports more prone to dehydration since bowel surgery in past. Does not sound like sudden onset or offset SVT based on description. Telemetry show sinus rhythm and sinus tachycardia with occasional PVCs and PACs. No evidence of ACS and TSH also normal. Agree with hydration and replete electrolytes. Would obtain echocardiogram to exclude cardiomyopathy. Also stop HCTZ and switch to beta blocker for hypertension control for now. Further recommendations to follow. If LVEF normal will likely  be OK for discharge and further outpatient followup. He would like to go to our [place] office since he works at [employer]. An outpatient cardiac monitor would be reasonable to better assess heart rate variability and further exclude arrhythmia.  2. Hypertension: Stopping HCTZ and switching to beta blocker for now.  3. Hypokalemia: This has been replaced and is now WNL. Likely from GI virus and HCTZ.   Signed: Bettey Mare. Lawrence NP AACC  10/11/2015, 10:05 AM Co-Sign MD  Attending note:  Patient seen and examined. I reviewed his available records and extensively modified the above note to reflect my historical findings, examination, and also my recommendations. Further recommendations to follow after review of  echocardiogram.  Jonelle Sidle, M.D., F.A.C.C.

## 2015-10-11 NOTE — H&P (Signed)
[facility] Admission History and Physical       Stephens Shreve AVW:[id number] DOB: 03-Apr-1971 DOA: 10/10/2015  Referring physician: EDP PCP: No primary care provider on file.  Specialists:   Chief Complaint: Palpitations  HPI: Jeffrey Morales is a 44 y.o. male with a history of HTN, Hyperlipidemia, and reported possible IBD, who presents to the ED with complaints of palpitations off and on x 1 week.   He reports that his heart rate ente t up to the 130's tonight.   He denies any chest pain, light headedness, or syncope.   He reports that he has had palpitations intermittently for months.    He reports that he has has a recent URI and took OTC decongestants and his PCP placed him on antibiotic Rx of Omnicef, and then contracted a GI virus when one of his children had been sick this past week.  He reports havingn multiple bouts of diarrhea for the past 3-4 days that he attributes to the virus as well as the antibiotic rx.    He was evaluated in the ED and found to have mild hypokalemia and hypomagnesemia.  He was administered IV Magnesium in the ED for a magnesium level of 1.6, and during infusion he had an episode of bradycardia into the 50's.   The EDP called and spoke to cardiology on call Dr Donnie Aho, who advised admission for observation and a cardiology consult in the AM.     Review of Systems:   Constitutional: No Weight Loss, No Weight Gain, Night Sweats, Fevers, Chills, Dizziness, Light Headedness, Fatigue, or Generalized Weakness HEENT: No Headaches, Difficulty Swallowing,Tooth/Dental Problems,Sore Throat,  No Sneezing, Rhinitis, Ear Ache, Nasal Congestion, or Post Nasal Drip,  Cardio-vascular:  No Chest pain, Orthopnea, PND, Edema in Lower Extremities, Anasarca, Dizziness, +Palpitations  Resp: No Dyspnea, No DOE, No Productive Cough, No Non-Productive Cough, No Hemoptysis, No Wheezing.    GI: No Heartburn, Indigestion, Abdominal Pain, Nausea, Vomiting, Diarrhea, Constipation,  Hematemesis, Hematochezia, Melena, Change in Bowel Habits,  Loss of Appetite  GU: No Dysuria, No Change in Color of Urine, No Urgency or Urinary Frequency, No Flank pain.  Musculoskeletal: No Joint Pain or Swelling, No Decreased Range of Motion, No Back Pain.  Neurologic: No Syncope, No Seizures, Muscle Weakness, Paresthesia, Vision Disturbance or Loss, No Diplopia, No Vertigo, No Difficulty Walking,  Skin: No Rash or Lesions. Psych: No Change in Mood or Affect, No Depression or Anxiety, No Memory loss, No Confusion, or Hallucinations   Past Medical History  Diagnosis Date  . Hypertension   . Reflux   . Bulging lumbar disc   . Hyperlipidemia      Past Surgical History  Procedure Laterality Date  . Colon surgery        Prior to Admission medications   Medication Sig Start Date End Date Taking? Authorizing Provider  benzonatate (TESSALON) 200 MG capsule Take 1 capsule (200 mg total) by mouth 3 (three) times daily as needed for cough. 10/05/15  Yes Faythe Ghee, PA-C  cefdinir (OMNICEF) 300 MG capsule Take 1 capsule (300 mg total) by mouth 2 (two) times daily. 10/05/15  Yes Faythe Ghee, PA-C  cholecalciferol (VITAMIN D) 1000 UNITS tablet Take 1,000 Units by mouth daily.   Yes Historical Provider, MD  Choline Fenofibrate (FENOFIBRIC ACID) 135 MG CPDR TAKE ONE CAPSULE BY MOUTH ONCE A DAY 09/03/15  Yes Historical Provider, MD  diclofenac (VOLTAREN) 75 MG EC tablet Take 75 mg by mouth 2 (two) times daily.  Yes Historical Provider, MD  fenofibrate (TRICOR) 145 MG tablet Take 145 mg by mouth daily.   Yes Historical Provider, MD  hydrochlorothiazide (HYDRODIURIL) 25 MG tablet Take 25 mg by mouth daily.   Yes Historical Provider, MD  Lactobacillus Rhamnosus, GG, (CULTURELLE PO) Take 1 capsule by mouth daily.   Yes Historical Provider, MD  loratadine (CLARITIN) 10 MG tablet Take 10 mg by mouth daily.   Yes Historical Provider, MD  omeprazole (PRILOSEC) 20 MG capsule Take 20 mg by mouth  daily.   Yes Historical Provider, MD  promethazine-codeine (PHENERGAN WITH CODEINE) 6.25-10 MG/5ML syrup TAKE EVERY 6 HRS AS NEEDED FOR COUGH 10/05/15  Yes Historical Provider, MD  traMADol (ULTRAM) 50 MG tablet Take 1 tablet (50 mg total) by mouth every 8 (eight) hours as needed. 08/12/15  Yes Tommie Maximiano Coss, FNP     Allergies  Allergen Reactions  . Clarithromycin Other (See Comments)    "out-of-body" experience  . Penicillins     Has patient had a PCN reaction causing immediate rash, facial/tongue/throat swelling, SOB or lightheadedness with hypotension: Yes Has patient had a PCN reaction causing severe rash involving mucus membranes or skin necrosis: No Has patient had a PCN reaction that required hospitalization No Has patient had a PCN reaction occurring within the last 10 years: No n If all of the above answers are "NO", then may proceed with Cephalosporin use.     Social History:  reports that he has never smoked. He does not have any smokeless tobacco history on file. He reports that he does not drink alcohol or use illicit drugs.    History reviewed. No pertinent family history.     Physical Exam:  GEN:  Pleasant Obese 44 y.o.  Caucasian male examined and in no acute distress; cooperative with exam Filed Vitals:   10/11/15 0015 10/11/15 0030 10/11/15 0045 10/11/15 0059  BP:    123/78  Pulse: 48 110 108 99  Resp: Height:      Weight:      SpO2: 99% 100% 98%    Blood pressure 123/78, pulse 99, resp. rate 22, height  (1.803 m), weight 124.739 kg (275 lb), SpO2 98 %. PSYCH: He is alert and oriented x4; does not appear anxious does not appear depressed; affect is normal HEENT: Normocephalic and Atraumatic, Mucous membranes pink; PERRLA; EOM intact; Fundi:  Benign;  No scleral icterus, Nares: Patent, Oropharynx: Clear,  Fair Dentition,    Neck:  FROM, No Cervical Lymphadenopathy nor Thyromegaly or Carotid Bruit; No JVD; Breasts:: Not examined CHEST  WALL: No tenderness CHEST: Normal respiration, clear to auscultation bilaterally HEART: Regular rate and rhythm; no murmurs rubs or gallops BACK: No kyphosis or scoliosis; No CVA tenderness ABDOMEN: Positive Bowel Sounds,  Obese, Soft Non-Tender, No Rebound or Guarding; No Masses, No Organomegaly Rectal Exam: Not done EXTREMITIES: NoCyanosis, Clubbing, or Edema; No Ulcerations. Genitalia: not examined PULSES: 2+ and symmetric SKIN: Normal hydration no rash or ulceration CNS:  Alert and Oriented x 4, No Focal Deficits  Vascular: pulses palpable throughout    Labs on Admission:  Basic Metabolic Panel:  Recent Labs Lab 10/10/15 2005  NA 142  K 3.4*  CL 107  CO2 27  GLUCOSE 109*  BUN 13  CREATININE 0.86  CALCIUM 9.7  MG 1.6*   Liver Function Tests:  Recent Labs Lab 10/10/15 2005  AST 26  ALT 36  ALKPHOS 64  BILITOT 0.5  PROT 7.0  ALBUMIN  3.9    Recent Labs Lab 10/10/15 2005  LIPASE 26   No results for input(s): AMMONIA in the last 168 hours. CBC:  Recent Labs Lab 10/10/15 2005  WBC 9.8  NEUTROABS 6.5  HGB 14.6  HCT 41.5  MCV 82.7  PLT 348   Cardiac Enzymes:  Recent Labs Lab 10/10/15 2005  TROPONINI <0.03    BNP (last 3 results) No results for input(s): BNP in the last 8760 hours.  ProBNP (last 3 results) No results for input(s): PROBNP in the last 8760 hours.  CBG: No results for input(s): GLUCAP in the last 168 hours.  Radiological Exams on Admission: Dg Chest Port 1 View  10/10/2015  CLINICAL DATA:  Acute onset of tachycardia and diaphoresis. Initial encounter. EXAM: PORTABLE CHEST 1 VIEW COMPARISON:  None. FINDINGS: The lungs are well-aerated. There is elevation of the right hemidiaphragm. Pulmonary vascularity is at the upper limits of normal. There is no evidence of focal opacification, pleural effusion or pneumothorax. The cardiomediastinal silhouette is within normal limits. No acute osseous abnormalities are seen. IMPRESSION:  Elevation of the right hemidiaphragm.  Lungs remain grossly clear. Electronically Signed   By: Roanna RaiderJeffery  Chang M.D.   On: 10/10/2015 20:36     EKG: Independently reviewed.     Assessment/Plan:   44 y.o. male with  Active Problems:   1.     Palpitations- Etiologies Include Dehydration, electrolyte Derangement, Thyroid Disease,  or possible a Sick Sinus Syndrome   Cardiac monitoring   2D ECHO in AM   Check TSH   Correct Electrolytes   Rehydrate with IVFs   Cardiology Consult in AM       2.      Dehydration   IVFs   Monitor BUN/Cr   Hold HCTZ Rx     3.     Hypokalemia   Replace with IV KCl     4.     Hypomagnesemia   Given 1 Gram IV Magnesium   Re-Check Magnesium in AM   Replete PRN     5.     Viral gastroenteritis   Discontinue Omnicef Rx   IVFs       6.     Hypertension   Monitor BPs   Hold HCTZ   PRN IV Hydralazine for elavated BP  PRN     7.     DVT Prophylaxis   Lovenox    Code Status:     FULL CODE   Family Communication:    Wife at Bedside    Disposition Plan:  Observation Status        Time spent:   9670 Minutes      Ron ParkerJENKINS,Mikah Rottinghaus C [facility] Pager [phone]   If 7AM -7PM Please Contact the Day Rounding Team MD for [facility]  If 7PM-7AM, Please Contact Night-Floor Coverage  [email or web address] Password [email or web address] 10/11/2015, 1:07 AM     ADDENDUM:   Patient was seen and examined on 10/11/2015

## 2015-10-11 NOTE — Progress Notes (Signed)
    Please see echocardiogram report, in summary:  - Mild LVH with LVEF 60-65% and indeterminate diastolic function.Trivial mitral regurgitation. RV not well seen but contraction is within normal range. Mild tricuspid regurgitation with PASP estimated 29 mmHg.  If he is feeling clinically better after hydration, and no other clinical possibilities need to be evaluated as an inpatient (PE seems unlikely), then likely stable for discharge and further outpatient workup. He would like to follow up in our [facility] office since he works at [employer]. We will have a visit arranged in the next few weeks, and I expect that further cardiac monitoring might be considered to better evaluate heart rate variability. Would continue on beta blocker for now for treatment of hypertension.   Jonelle SidleSamuel G. Agness Morales, M.D., F.A.C.C.

## 2015-10-19 ENCOUNTER — Encounter: Payer: Self-pay | Admitting: Gastroenterology

## 2015-10-27 ENCOUNTER — Encounter: Payer: Self-pay | Admitting: Cardiovascular Disease

## 2015-10-27 ENCOUNTER — Ambulatory Visit (INDEPENDENT_AMBULATORY_CARE_PROVIDER_SITE_OTHER): Payer: BLUE CROSS/BLUE SHIELD | Admitting: Cardiovascular Disease

## 2015-10-27 VITALS — BP 140/94 | HR 99 | Ht 70.0 in | Wt 288.8 lb

## 2015-10-27 DIAGNOSIS — E860 Dehydration: Secondary | ICD-10-CM

## 2015-10-27 DIAGNOSIS — E876 Hypokalemia: Secondary | ICD-10-CM

## 2015-10-27 DIAGNOSIS — R000 Tachycardia, unspecified: Secondary | ICD-10-CM | POA: Diagnosis not present

## 2015-10-27 DIAGNOSIS — E669 Obesity, unspecified: Secondary | ICD-10-CM

## 2015-10-27 DIAGNOSIS — I493 Ventricular premature depolarization: Secondary | ICD-10-CM

## 2015-10-27 MED ORDER — METOPROLOL TARTRATE 25 MG PO TABS: 25.0000 mg | ORAL_TABLET | Freq: Two times a day (BID) | ORAL | Status: DC | PRN

## 2015-10-27 NOTE — Assessment & Plan Note (Signed)
Blood pressure mildly elevated. Recommended he increase his metoprolol up to 25 mg twice a day Other options would be to change to metoprolol succinate (even bystolic to minimize side effects) This would help with blood pressure and heart rate

## 2015-10-27 NOTE — Assessment & Plan Note (Signed)
Low potassium secondary to acute diarrhea and HCTZ.

## 2015-10-27 NOTE — Progress Notes (Signed)
Patient ID: Jeffrey Morales, male    DOB: 1971-07-19, 44 y.o.   MRN: [id number]  HPI Comments: Jeffrey Morales is a pleasant 44 year old gentleman with history of obesity, elevated triglycerides, baseline tachycardia, hypertension who presents to establish care in the [place] office. Seen recently at  [facility] with symptoms of dehydration, tachycardia . History of partial colectomy in the past Works at [employer], stroke coordinator  Recent hospital records reviewed in detail with him He reports that he had the GI bug, was on HCTZ at the time . He is having significant tachycardia, palpitations . EKG showed a rare APC, PVCs with sinus tachycardia, rate in the 120 range  Magnesium 1.6, potassium 3.4  He was hydrated with improvement of his symptoms  Echocardiogram showed normal LV function , question of mild LVH, otherwise normal study  Since discharge he reports that he has been doing better. He's been taking metoprolol tartrate 12.5 mill grams twice a day HCTZ was held  Baseline heart rate in the 80s, with exertion more than 100 He monitors heart rate with a fit bit. Denies any chest pain, no significant lower extremity edema (sometimes after sitting for long periods of time will have mild edema).  EKG on today's visit shows normal sinus rhythm with rate 99 bpm, no significant ST or T-wave changes     Allergies  Allergen Reactions  . Clarithromycin Other (See Comments)    "out-of-body" experience  . Penicillins     Has patient had a PCN reaction causing immediate rash, facial/tongue/throat swelling, SOB or lightheadedness with hypotension: Yes Has patient had a PCN reaction causing severe rash involving mucus membranes or skin necrosis: No Has patient had a PCN reaction that required hospitalization No Has patient had a PCN reaction occurring within the last 10 years: No n If all of the above answers are "NO", then may proceed with Cephalosporin use.     Current Outpatient  Prescriptions on File Prior to Visit  Medication Sig Dispense Refill  . Choline Fenofibrate (FENOFIBRIC ACID) 135 MG CPDR TAKE ONE CAPSULE BY MOUTH ONCE A DAY  1  . diclofenac (VOLTAREN) 75 MG EC tablet Take 75 mg by mouth 2 (two) times daily.    . fenofibrate (TRICOR) 145 MG tablet Take 145 mg by mouth daily.    Marland Kitchen loratadine (CLARITIN) 10 MG tablet Take 10 mg by mouth daily.    Marland Kitchen omeprazole (PRILOSEC) 20 MG capsule Take 20 mg by mouth daily.    . traMADol (ULTRAM) 50 MG tablet Take 1 tablet (50 mg total) by mouth every 8 (eight) hours as needed. 20 tablet 0   No current facility-administered medications on file prior to visit.    Past Medical History  Diagnosis Date  . Essential hypertension   . Reflux   . Bulging lumbar disc   . Hyperlipidemia     Past Surgical History  Procedure Laterality Date  . Colon surgery    . Mandible fracture surgery      Social History  reports that he has never smoked. He does not have any smokeless tobacco history on file. He reports that he does not drink alcohol or use illicit drugs.  Family History family history includes Hypertension in his father.    Review of Systems  Constitutional: Negative.   HENT: Negative.   Respiratory: Negative.   Cardiovascular: Positive for leg swelling.  Gastrointestinal: Negative.   Musculoskeletal: Negative.   Neurological: Negative.   Hematological: Negative.   Psychiatric/Behavioral: Negative.  All other systems reviewed and are negative.   BP 140/94 mmHg  Pulse 99  Ht 5\' 10"  (1.778 m)  Wt 288 lb 12 oz (130.976 kg)  BMI 41.43 kg/m2  Physical Exam  Constitutional: He is oriented to person, place, and time. He appears well-developed and well-nourished.  Obese  HENT:  Head: Normocephalic.  Nose: Nose normal.  Mouth/Throat: Oropharynx is clear and moist.  Eyes: Conjunctivae are normal. Pupils are equal, round, and reactive to light.  Neck: Normal range of motion. Neck supple. No JVD present.   Cardiovascular: Normal rate, regular rhythm, normal heart sounds and intact distal pulses.  Exam reveals no gallop and no friction rub.   No murmur heard. Pulmonary/Chest: Effort normal and breath sounds normal. No respiratory distress. He has no wheezes. He has no rales. He exhibits no tenderness.  Abdominal: Soft. Bowel sounds are normal. He exhibits no distension. There is no tenderness.  Musculoskeletal: Normal range of motion. He exhibits no edema or tenderness.  Lymphadenopathy:    He has no cervical adenopathy.  Neurological: He is alert and oriented to person, place, and time. Coordination normal.  Skin: Skin is warm and dry. No rash noted. No erythema.  Psychiatric: He has a normal mood and affect. His behavior is normal. Judgment and thought content normal.

## 2015-10-27 NOTE — Assessment & Plan Note (Signed)
We have encouraged continued exercise, careful diet management in an effort to lose weight. 

## 2015-10-27 NOTE — Patient Instructions (Signed)
You are doing well. No medication changes were made.  Please call us if you have new issues that need to be addressed before your next appt.  Your physician wants you to follow-up in: 12 months.  You will receive a reminder letter in the mail two months in advance. If you don't receive a letter, please call our office to schedule the follow-up appointment. 

## 2015-10-27 NOTE — Assessment & Plan Note (Signed)
Hospital records were reviewed. He had diarrhea, was on HCTZ contributing to his electrolyte abnormality.symptoms exacerbated by partial colectomy in the past.  Currently not taking HCTZ.  In the future we'll avoid diuretics

## 2015-10-27 NOTE — Assessment & Plan Note (Signed)
Continues to have tachycardia even on low-dose metoprolol tartrate 12.5 mg twice a day. Recommended he increase the dose up to 25 mg twice a day. May need to slowly titrate the dose upwards for rate and blood pressure control

## 2015-11-01 NOTE — Telephone Encounter (Signed)
This encounter was created in error - please disregard.

## 2015-11-18 ENCOUNTER — Telehealth: Payer: Self-pay | Admitting: *Deleted

## 2015-11-18 NOTE — Telephone Encounter (Signed)
Pt calling stating he was seen by us a few weeks ago. Had some PVC's and we put him on metoprolol lately at night he seems to be getting the PVC's back Is taking some cold medication along with all this not sure if this could be causing it Please advise

## 2015-11-18 NOTE — Telephone Encounter (Signed)
Spoke w/ pt.   He reports that he has had a sinus infections recently and has been taking Mucinex D.   Reports that his HR has been elevated, esp at night, up to the 130s. Reports HR today is around 100. He has been adjusting his metoprolol based on increased PVCs. Advised pt to avoid OTC cold meds that contain decongestants and to call back if sx do not improve.

## 2015-12-21 ENCOUNTER — Ambulatory Visit: Payer: Self-pay | Admitting: Gastroenterology

## 2016-02-08 ENCOUNTER — Telehealth: Payer: Self-pay | Admitting: Cardiovascular Disease

## 2016-02-08 NOTE — Telephone Encounter (Signed)
Spoke w/ pt.  He reports that his 6518-mo old daughter had the flu & strep throat last week. He had a fever this weekend, but head stuffiness & runny nose have resolved.  Since then, he has noticed that he becomes winded sooner on the treadmill and becomes SOB while taking the stairs. His HR was consistently in the 70s, is now in the 90s. Pt feels that he is dehydrated, but also retaining fluid, as his ankles are swollen. Pt follows a low carb diet (not low Na+), drinks 3-20 oz tumblers of water per day. Pt has a desk job as stroke coordinator @ [employer], does not elevate feet or wear TED hose during the day. Pt had 12" of small intestine and 6" of large intestine removed, does not have solid BMs, so he becomes dehydrated easily. He would like an appt to come in for evaluation.  Advised him that I will make Dr. Mariah MillingGollan aware and call him back w/ his recommendation.

## 2016-02-08 NOTE — Telephone Encounter (Signed)
Pt calling stating last week his daughter had flu/strep Pt thinks he had gotten sick from that but since he's better he has some concerns States he is getting some SOB going up the stairs at home.  Having some pressure in his chest, also having some swelling in his legs.  Only SOB with exertion  Used to do treadmill every morning for an hour and would be okay.  But it is concerning to him now he can't  Please advise.  Also states Dystolic number may sometimes go up to 100's

## 2016-02-08 NOTE — Telephone Encounter (Signed)
Spoke w/ pt.  Advised him that Dr. Mariah MillingGollan reviewed his chart and thinks that his sx could be 2/2 viral strep. He may not feel better for a week or 2 while his body is getting over it. Recommend that he increase his fluids, elevate his feet and wear TED hose when sitting. Advised him to call his PCP if his sx do not improve. He is appreciative and will call back if we can be of further assistance.

## 2016-02-09 DIAGNOSIS — C4491 Basal cell carcinoma of skin, unspecified: Secondary | ICD-10-CM

## 2016-02-09 HISTORY — DX: Basal cell carcinoma of skin, unspecified: C44.91

## 2016-02-10 ENCOUNTER — Encounter: Payer: Self-pay | Admitting: Gastroenterology

## 2016-02-10 ENCOUNTER — Ambulatory Visit (INDEPENDENT_AMBULATORY_CARE_PROVIDER_SITE_OTHER): Payer: BLUE CROSS/BLUE SHIELD | Admitting: Gastroenterology

## 2016-02-10 VITALS — BP 138/80 | HR 86 | Ht 71.0 in | Wt 280.2 lb

## 2016-02-10 DIAGNOSIS — Z9049 Acquired absence of other specified parts of digestive tract: Secondary | ICD-10-CM

## 2016-02-10 DIAGNOSIS — K219 Gastro-esophageal reflux disease without esophagitis: Secondary | ICD-10-CM | POA: Diagnosis not present

## 2016-02-10 DIAGNOSIS — K529 Noninfective gastroenteritis and colitis, unspecified: Secondary | ICD-10-CM

## 2016-02-10 DIAGNOSIS — Z9289 Personal history of other medical treatment: Secondary | ICD-10-CM | POA: Diagnosis not present

## 2016-02-10 MED ORDER — OMEPRAZOLE 20 MG PO CPDR: 20.0000 mg | DELAYED_RELEASE_CAPSULE | Freq: Two times a day (BID) | ORAL | Status: DC

## 2016-02-10 NOTE — Patient Instructions (Addendum)
We will increase you Omeprazole today to twice daily  We will obtain your records from Dr Allena KatzPatel in [place]  If you are age 765 or older, your body mass index should be between 23-30. Your Body mass index is 39.1 kg/(m^2). If this is out of the aforementioned range listed, please consider follow up with your Primary Care Provider.  If you are age 45 or younger, your body mass index should be between 19-25. Your Body mass index is 39.1 kg/(m^2). If this is out of the aformentioned range listed, please consider follow up with your Primary Care Provider.

## 2016-02-10 NOTE — Progress Notes (Signed)
HPI :  45 y/o male seen her in consultation today from Dr. Suzy Bouchard for evaluation for history of colon perforation, chronic diarrhea, and GERD.  He reports he had a spontaneous colon perforation in 2003 noted, after what he reports was experiencing months of abdominal pains which were bothering him. He reports he had a prior workup at that time with EGD showing a gastric ulcer as well as a colonoscopy although is not clear of the results, and then he had a CT Scan which showed a colonic perforation which led to an emergent surgery. He had 12 inches of small bowel and 6 inches of colon resected. After his surgery his abdominal pain had resolved. He thinks he has had a colonoscopy since his surgery, he thinks around 2004 or so, but not sure. He was not sure if there was evidence of Crohns.   He was told initially he had Crohns disease which caused the perforation and he was placed on Pentasa for about a year. He had stopped it after a year and it had made no difference in his symptoms and he continued to do well. He takes a daily fiber supplement but he has increased stool frequency at baseline. He takes immodium PRN at baseline which can help. He is having on average 4-8 BMs per day, usually on the looser side, since his surgery. He denies any significant gas or bloating. He has no chronic abdominal pains. He denies any weight loss, he has hard time losing weight. No FH of colon cancer. Father had lymphoma. Mother has had a history of colitis, unclear details of this.   He reported recently he had some chest discomfort after he eats, feels some pressure in his chest which radiates into his back. He has had some intermittent cramps in his chest. He thinks it felt like the prior ulcer has had in his stomach. He takes prilosec  every AM, which he states usually controls his pyrosis but he continues to have regurgitation. He denies dysphagia. No odynophagia. No nausea or vomiting. He has had some  postprandial cramping in his substernal area which has been intermittent. He was seen by his cardiologist and did not think this was ischemic in nature given he had previously had a negative workup to include normal echocardiogram. He had previously had PVCs the setting of electrolyte abnormalities when he was admitted with a Norovirus. He has had a prior EGD in 2004 he thinks, not sure of the details. He had otherwise had a prior history of a GI Bleed which led to hospitalization in 1993, and had an EGD and colonoscopy and had no clear etiology at the time other than hemorrhoids.   He takes diclofenac twice daily every day for low back pain. He has had epidural injections for pain. He thinks the back pain is reasonably well controlled at this time.   Past Medical History  Diagnosis Date  . Essential hypertension   . Reflux   . Bulging lumbar disc   . Hyperlipidemia   . PVC (premature ventricular contraction)   . Chronic diarrhea     Past Surgical History  Procedure Laterality Date  . Colon surgery    . Mandible fracture surgery     Family History  Problem Relation Age of Onset  . Hypertension Father   . Lymphoma Father    Social History  Substance Use Topics  . Smoking status: Never Smoker   . Smokeless tobacco: None  . Alcohol Use: No  Current Outpatient Prescriptions  Medication Sig Dispense Refill  . B Complex-Folic Acid (SUPER B COMPLEX MAXI PO) Take 1 capsule by mouth daily.    . diclofenac (VOLTAREN) 75 MG EC tablet Take 75 mg by mouth 2 (two) times daily.    . fenofibrate (TRICOR) 145 MG tablet Take 145 mg by mouth daily.    Marland Kitchen. loratadine (CLARITIN) 10 MG tablet Take 10 mg by mouth daily.    . metoprolol tartrate (LOPRESSOR) 12.5 mg TABS tablet Take 12.5 mg by mouth 2 (two) times daily.    Marland Kitchen. omeprazole (PRILOSEC) 20 MG capsule Take 1 capsule (20 mg total) by mouth 2 (two) times daily before a meal. 120 capsule 3   No current facility-administered medications for this  visit.   Allergies  Allergen Reactions  . Clarithromycin Other (See Comments)    "out-of-body" experience  . Penicillins     Has patient had a PCN reaction causing immediate rash, facial/tongue/throat swelling, SOB or lightheadedness with hypotension: Yes Has patient had a PCN reaction causing severe rash involving mucus membranes or skin necrosis: No Has patient had a PCN reaction that required hospitalization No Has patient had a PCN reaction occurring within the last 10 years: No n If all of the above answers are "NO", then may proceed with Cephalosporin use.      Review of Systems: All systems reviewed and negative except where noted in HPI.   Lab Results  Component Value Date   WBC 8.5 10/11/2015   HGB 13.1 10/11/2015   HCT 38.4* 10/11/2015   MCV 82.9 10/11/2015   PLT 334 10/11/2015    Lab Results  Component Value Date   CREATININE 0.79 10/11/2015   BUN 8 10/11/2015   NA 141 10/11/2015   K 3.7 10/11/2015   CL 111 10/11/2015   CO2 24 10/11/2015    Lab Results  Component Value Date   ALT 36 10/10/2015   AST 26 10/10/2015   ALKPHOS 64 10/10/2015   BILITOT 0.5 10/10/2015     Physical Exam: BP 138/80 mmHg  Pulse 86  Ht 5\' 11"  (1.803 m)  Wt 280 lb 3.2 oz (127.098 kg)  BMI 39.10 kg/m2 Constitutional: Pleasant,well-developed, male in no acute distress. HEENT: Normocephalic and atraumatic. Conjunctivae are normal. No scleral icterus. Neck supple.  Cardiovascular: Normal rate, regular rhythm.  Pulmonary/chest: Effort normal and breath sounds normal. No wheezing, rales or rhonchi. Abdominal: Soft, protuberant, nontender. Bowel sounds active throughout. There are no masses palpable. No hepatomegaly. Extremities: no edema Lymphadenopathy: No cervical adenopathy noted. Neurological: Alert and oriented to person place and time. Skin: Skin is warm and dry. No rashes noted. Psychiatric: Normal mood and affect. Behavior is normal.   ASSESSMENT AND PLAN: 45 y/o  male with PMH as outlined above here for evaluation of the following issues:  GERD - I suspect this is causing his recent chest symptoms, seems postprandial in nature. Discussed risks / benefits of PPIs, recommend he increase omeprazole to BID at this time to see if it helps. If no improvement with these measures we may consider EGD. I provided consent to obtain his prior records to clarify what old EGD showed. This will be reviewed once available and added to his record. I otherwise recommended he avoid diclofenac if possible and other NSAIDs give his ongoing symptoms and see if this helps at all, especially with his reported history of PUD. He will try tylenol instead and agreed.   Chronic diarrhea / history of colonic perforation - unclear  what caused his prior perforation in 2003 although since this was treated he has not had any further abdominal pain. There was a suspicion for possible Crohns, however would think after all these years without any Crohns therapy this would be quite active and his symptoms seem at baseline and no further pain or alarm symptoms. I suspect his diarrhea is from his post-surgical state and seems stable. With ileocectomy he is at risk for small bowel overgrowth but doesn't have classic symptoms for this at present time. He will continue his present regimen as bowel symptoms are stable. I again will try to obtain old records from his prior surgery and colonoscopy to clarify the findings. Once I have this, if it did appear that there was a suggestion of Crohns disease we may consider repeat colonoscopy or small bowel imaging to reevaluate this. He agreed will let him know when I have received this and had a chance to review it.   Ileene Patrick, MD [facility] Pager [phone]  CC: Zachery Dauer, MD

## 2016-03-02 ENCOUNTER — Telehealth: Payer: Self-pay | Admitting: Gastroenterology

## 2016-03-02 DIAGNOSIS — R197 Diarrhea, unspecified: Secondary | ICD-10-CM

## 2016-03-02 NOTE — Telephone Encounter (Signed)
Jeffrey KocherRegina I have had the chance to review this patient's surgical records from 2003 which was concerning for Crohns disease. Given his ongoing diarrhea a colonoscopy is recommended to evaluate the anastomosis and see if he has any evidence of active Crohns disease. Can you relay my recommendations and help book him for the exam. If he wants to speak with me first about this, I am happy to do so. Thanks

## 2016-03-02 NOTE — Telephone Encounter (Signed)
Spoke with patient and scheduled colonoscopy on 03/20/16 3:00 PM and pre visit on 03/08/16 at 3:30 PM.

## 2016-03-02 NOTE — Telephone Encounter (Signed)
Patient's prior records were obtained and reviewed as outlined: In May of 2003 he presented to [facility] with severe abdominal pain. CT scan showing inflammatory changes in the RLQ, concerning for Crohns vs. Appendicitis. 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 Ileocecal valve resection was performed. Pathology was most consistent with Crohns disease with purulent serositis and abscess.

## 2016-03-08 ENCOUNTER — Ambulatory Visit (AMBULATORY_SURGERY_CENTER): Payer: Self-pay | Admitting: *Deleted

## 2016-03-08 VITALS — Ht 70.0 in | Wt 277.2 lb

## 2016-03-08 DIAGNOSIS — R197 Diarrhea, unspecified: Secondary | ICD-10-CM

## 2016-03-08 MED ORDER — SUPREP BOWEL PREP KIT 17.5-3.13-1.6 GM/177ML PO SOLN: 1.0000 | Freq: Once | ORAL | Status: DC

## 2016-03-08 NOTE — Progress Notes (Signed)
Patient denies any allergies to egg or soy products. Patient denies complications with anesthesia/sedation.  Patient denies oxygen use at home and denies diet medications. Emmi instructions for colonoscopy explained but patient denied.     

## 2016-03-14 ENCOUNTER — Ambulatory Visit (AMBULATORY_SURGERY_CENTER): Payer: BLUE CROSS/BLUE SHIELD | Admitting: Gastroenterology

## 2016-03-14 ENCOUNTER — Encounter: Payer: Self-pay | Admitting: Gastroenterology

## 2016-03-14 VITALS — BP 119/89 | HR 92 | Temp 99.6°F | Resp 19 | Ht 70.0 in | Wt 277.0 lb

## 2016-03-14 DIAGNOSIS — R197 Diarrhea, unspecified: Secondary | ICD-10-CM | POA: Diagnosis not present

## 2016-03-14 MED ORDER — SODIUM CHLORIDE 0.9 % IV SOLN: 500.0000 mL | INTRAVENOUS | Status: DC

## 2016-03-14 NOTE — Op Note (Signed)
[facility] Patient Name: Jeffrey Morales Procedure Date: 03/14/2016 3:12 PM MRN: [id number] Endoscopist: Viviann Spare P. Adela Lank , MD Age: 45 Date of Birth: 08-Mar-1971 Gender: Male Procedure:                Colonoscopy Indications:              Chronic diarrhea, history of ileocecectomy in 2003                            with surgical pathology concerning for Crohns                            disease, not on therapy, on diclofenac twice daily                            for chronic back pain Medicines:                Monitored Anesthesia Care Procedure:                Pre-Anesthesia Assessment:                           - Prior to the procedure, a History and Physical                            was performed, and patient medications and                            allergies were reviewed. The patient's tolerance of                            previous anesthesia was also reviewed. The risks                            and benefits of the procedure and the sedation                            options and risks were discussed with the patient.                            All questions were answered, and informed consent                            was obtained. Prior Anticoagulants: The patient has                            taken no previous anticoagulant or antiplatelet                            agents. ASA Grade Assessment: II - A patient with                            mild systemic disease. After reviewing the risks  and benefits, the patient was deemed in                            satisfactory condition to undergo the procedure.                           After obtaining informed consent, the colonoscope                            was passed under direct vision. Throughout the                            procedure, the patient's blood pressure, pulse, and                            oxygen saturations were monitored continuously. The        Model CF-HQ190L [id number]) scope was introduced                            through the anus and advanced to the the                            ileocolonic anastomosis. The colonoscopy was                            performed without difficulty. The patient tolerated                            the procedure well. The quality of the bowel                            preparation was adequate. The terminal ileum,                            ileocecal valve, appendiceal orifice, and rectum                            were photographed. Scope In: 3:19:56 PM Scope Out: 3:37:48 PM Scope Withdrawal Time: 0 hours 16 minutes 29 seconds  Total Procedure Duration: 0 hours 17 minutes 52 seconds  Findings:                 The perianal and digital rectal examinations were                            normal.                           There was evidence of a prior end-to-side                            ileo-colonic anastomosis in the right colon. This                            was patent and was characterized by multiple areas  of ulcerated mucosa around the anastomosis. The                            anastomosis was not traversed due to a very                            angulated entrance into the anastomosis, although                            the ileal mucosa proximal to anastomosis appeared                            normal. The anastomosis was biopsied with a cold                            forceps for histology.                           A patchy area of mildly erythematous mucosa was                            found in the sigmoid colon, without ulceration or                            erosion.                           A few small-mouthed diverticula were found in the                            sigmoid colon.                           The exam was otherwise without abnormality. No                            other inflammatory changes were appreciated                            Biopsies for histology were taken with a cold                            forceps from the right colon and left colon for                            evaluation of microscopic colitis. Complications:            No immediate complications. Estimated blood loss:                            Minimal. Estimated Blood Loss:     Estimated blood loss was minimal. Impression:               - Patent end-to-side ileo-colonic anastomosis,  characterized by ulceration. Biopsied. Unclear if                            this represents changes due to chronic NSAIDs,                            versus ischemic changes at the surgical                            anastomosis, versus Crohns disease                           - Erythematous mucosa in the sigmoid colon, seems                            superficial and less likely IBD. Biopsies obtained                           - Diverticulosis in the sigmoid colon.                           - The examination was otherwise normal.                           - Biopsies were taken with a cold forceps from the                            right colon and left colon for evaluation of                            microscopic colitis. Recommendation:           - Patient has a contact number available for                            emergencies. The signs and symptoms of potential                            delayed complications were discussed with the                            patient. Return to normal activities tomorrow.                            Written discharge instructions were provided to the                            patient.                           - Resume previous diet.                           - Continue present medications.                           -  Minimize NSAID use (diclofenac), trial of tylenol                            for pain versus other modalities                           - Await pathology results.                            - Repeat colonoscopy may be recommended for                            surveillance. The colonoscopy date will be                            determined after pathology results from today's                            exam become available for review. Viviann Spare P. Anacristina Steffek, MD 03/14/2016 3:47:21 PM This report has been signed electronically.

## 2016-03-14 NOTE — Progress Notes (Signed)
A/ox3 pleased with MAC, report to Wendy RN 

## 2016-03-14 NOTE — Patient Instructions (Addendum)
YOU HAD AN ENDOSCOPIC PROCEDURE TODAY AT THE [facility]:   Refer to the procedure report that was given to you for any specific questions about what was found during the examination.  If the procedure report does not answer your questions, please call your gastroenterologist to clarify.  If you requested that your care partner not be given the details of your procedure findings, then the procedure report has been included in a sealed envelope for you to review at your convenience later.  YOU SHOULD EXPECT: Some feelings of bloating in the abdomen. Passage of more gas than usual.  Walking can help get rid of the air that was put into your GI tract during the procedure and reduce the bloating. If you had a lower endoscopy (such as a colonoscopy or flexible sigmoidoscopy) you may notice spotting of blood in your stool or on the toilet paper. If you underwent a bowel prep for your procedure, you may not have a normal bowel movement for a few days.  Please Note:  You might notice some irritation and congestion in your nose or some drainage.  This is from the oxygen used during your procedure.  There is no need for concern and it should clear up in a day or so.  SYMPTOMS TO REPORT IMMEDIATELY:   Following lower endoscopy (colonoscopy or flexible sigmoidoscopy):  Excessive amounts of blood in the stool  Significant tenderness or worsening of abdominal pains  Swelling of the abdomen that is new, acute  Fever of 100F or higher  For urgent or emergent issues, a gastroenterologist can be reached at any hour by calling [phone].   DIET: Your first meal following the procedure should be a small meal and then it is ok to progress to your normal diet. Heavy or fried foods are harder to digest and may make you feel nauseous or bloated.  Likewise, meals heavy in dairy and vegetables can increase bloating.  Drink plenty of fluids but you should avoid alcoholic beverages for 24  hours.  ACTIVITY:  You should plan to take it easy for the rest of today and you should NOT DRIVE or use heavy machinery until tomorrow (because of the sedation medicines used during the test).    FOLLOW UP: Our staff will call the number listed on your records the next business day following your procedure to check on you and address any questions or concerns that you may have regarding the information given to you following your procedure. If we do not reach you, we will leave a message.  However, if you are feeling well and you are not experiencing any problems, there is no need to return our call.  We will assume that you have returned to your regular daily activities without incident.  If any biopsies were taken you will be contacted by phone or by letter within the next 1-3 weeks.  Please call us at [phone] if you have not heard about the biopsies in 3 weeks.    SIGNATURES/CONFIDENTIALITY: You and/or your care partner have signed paperwork which will be entered into your electronic medical record.  These signatures attest to the fact that that the information above on your After Visit Summary has been reviewed and is understood.  Full responsibility of the confidentiality of this discharge information lies with you and/or your care-partner.  Minimize NSAID use (diclofenac). Await biopsy results. Please review diverticulosis and high fiber diet handouts provided.

## 2016-03-15 ENCOUNTER — Telehealth: Payer: Self-pay | Admitting: *Deleted

## 2016-03-15 NOTE — Telephone Encounter (Signed)
  Follow up Call-  Call back number 03/14/2016  Post procedure Call Back phone  # [phone]  Permission to leave phone message Yes     Patient questions:  Do you have a fever, pain , or abdominal swelling? No. Pain Score  0 *  Have you tolerated food without any problems? Yes.    Have you been able to return to your normal activities? Yes.    Do you have any questions about your discharge instructions: Diet   No. Medications  No. Follow up visit  No.  Do you have questions or concerns about your Care? No.  Actions: * If pain score is 4 or above: No action needed, pain <4.

## 2016-03-20 ENCOUNTER — Encounter: Payer: Self-pay | Admitting: Gastroenterology

## 2016-05-02 ENCOUNTER — Encounter: Payer: Self-pay | Admitting: *Deleted

## 2016-05-03 ENCOUNTER — Encounter: Payer: Self-pay | Admitting: Gastroenterology

## 2016-05-03 ENCOUNTER — Ambulatory Visit (INDEPENDENT_AMBULATORY_CARE_PROVIDER_SITE_OTHER): Payer: BLUE CROSS/BLUE SHIELD | Admitting: Gastroenterology

## 2016-05-03 ENCOUNTER — Other Ambulatory Visit (INDEPENDENT_AMBULATORY_CARE_PROVIDER_SITE_OTHER): Payer: Self-pay

## 2016-05-03 VITALS — BP 114/88 | HR 86 | Ht 71.0 in | Wt 276.0 lb

## 2016-05-03 DIAGNOSIS — Z8719 Personal history of other diseases of the digestive system: Secondary | ICD-10-CM

## 2016-05-03 DIAGNOSIS — R933 Abnormal findings on diagnostic imaging of other parts of digestive tract: Secondary | ICD-10-CM

## 2016-05-03 LAB — CBC WITH DIFFERENTIAL/PLATELET
BASOS ABS: 0 10*3/uL (ref 0.0–0.1)
BASOS PCT: 0.3 % (ref 0.0–3.0)
EOS ABS: 0.1 10*3/uL (ref 0.0–0.7)
Eosinophils Relative: 0.9 % (ref 0.0–5.0)
HCT: 44.1 % (ref 39.0–52.0)
Hemoglobin: 14.9 g/dL (ref 13.0–17.0)
Lymphocytes Relative: 29.6 % (ref 12.0–46.0)
Lymphs Abs: 2.4 10*3/uL (ref 0.7–4.0)
MCHC: 33.7 g/dL (ref 30.0–36.0)
MCV: 83.4 fl (ref 78.0–100.0)
MONO ABS: 0.5 10*3/uL (ref 0.1–1.0)
Monocytes Relative: 5.8 % (ref 3.0–12.0)
NEUTROS ABS: 5.2 10*3/uL (ref 1.4–7.7)
Neutrophils Relative %: 63.4 % (ref 43.0–77.0)
PLATELETS: 327 10*3/uL (ref 150.0–400.0)
RBC: 5.28 Mil/uL (ref 4.22–5.81)
RDW: 14.4 % (ref 11.5–15.5)
WBC: 8.2 10*3/uL (ref 4.0–10.5)

## 2016-05-03 LAB — BASIC METABOLIC PANEL
BUN: 17 mg/dL (ref 6–23)
CHLORIDE: 104 meq/L (ref 96–112)
CO2: 28 mEq/L (ref 19–32)
Calcium: 9.5 mg/dL (ref 8.4–10.5)
Creatinine, Ser: 1.05 mg/dL (ref 0.40–1.50)
GFR: 81.3 mL/min (ref >60.00)
Glucose, Bld: 91 mg/dL (ref 70–99)
POTASSIUM: 4.1 meq/L (ref 3.5–5.1)
SODIUM: 136 meq/L (ref 135–145)

## 2016-05-03 NOTE — Progress Notes (Signed)
HPI :  INTAKE VISIT: 45 y/o male seen her in consultation today from Dr. Suzy Bouchard for evaluation for history of colon perforation, chronic diarrhea, and GERD.  He reports he had a spontaneous colon perforation in 2003 noted, after what he reports was experiencing months of abdominal pains which were bothering him. He reports he had a prior workup at that time with EGD showing a gastric ulcer as well as a colonoscopy although is not clear of the results, and then he had a CT Scan which showed a colonic perforation which led to an emergent surgery. He had 12 inches of small bowel and 6 inches of colon resected. After his surgery his abdominal pain had resolved. He thinks he has had a colonoscopy since his surgery, he thinks around 2004 or so, but not sure. He was not sure if there was evidence of Crohns.   He was told initially he had Crohns disease which caused the perforation and he was placed on Pentasa for about a year. He had stopped it after a year and it had made no difference in his symptoms and he continued to do well. He takes a daily fiber supplement but he has increased stool frequency at baseline. He takes immodium PRN at baseline which can help. He is having on average 4-8 BMs per day, usually on the looser side, since his surgery. He denies any significant gas or bloating. He has no chronic abdominal pains. He denies any weight loss, he has hard time losing weight. No FH of colon cancer. Father had lymphoma. Mother has had a history of colitis, unclear details of this.   He reported recently he had some chest discomfort after he eats, feels some pressure in his chest which radiates into his back. He has had some intermittent cramps in his chest. He thinks it felt like the prior ulcer has had in his stomach. He takes prilosec  every AM, which he states usually controls his pyrosis but he continues to have regurgitation. He denies dysphagia. No odynophagia. No nausea or vomiting. He has  had some postprandial cramping in his substernal area which has been intermittent. He was seen by his cardiologist and did not think this was ischemic in nature given he had previously had a negative workup to include normal echocardiogram. He had previously had PVCs the setting of electrolyte abnormalities when he was admitted with a Norovirus. He has had a prior EGD in 2004 he thinks, not sure of the details. He had otherwise had a prior history of a GI Bleed which led to hospitalization in 1993, and had an EGD and colonoscopy and had no clear etiology at the time other than hemorrhoids.   He takes diclofenac twice daily every day for low back pain. He has had epidural injections for pain. He thinks the back pain is reasonably well controlled at this time.   SINCE LAST VISIT:  Patient's prior records were obtained and reviewed as outlined: In May of 2003 he presented to [facility] medical center with severe abdominal pain. CT scan showing inflammatory changes in the RLQ, concerning for Crohns vs. Appendicitis. 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 Ileocecal valve resection was performed. Pathology was most consistent with Crohns disease with purulent serositis and abscess  Since the last visit he had ulcerations noted at the anastomosis itself, but did not appear to involve the rest  of the ileum. Biopsies show nonspecific inflammation. The colon was otherwise normal.   Patient reports feeling well since I have seen him. He has no diarrhea. He is taking a probiotic daily. He is not needing to take fiber supplement. He has rare loose stools. He is having roughly 2 BMs per day. No blood in the stools. No abdominal pains. Eating well without limitations or issues. I had asked him to stop NSAID use but he has severe back pains and it works well for his pain. He continues to take diclofenac routinely,  taking 75mg  BID. He takes this for lower back pains, seeing chiropracter and doing stretching exercises. He is here to discuss further management.     Past Medical History  Diagnosis Date  . Essential hypertension   . Reflux   . Bulging lumbar disc     L1-S1  . Hyperlipidemia   . PVC (premature ventricular contraction)     no problems currently  . Chronic diarrhea   . Allergy   . GERD (gastroesophageal reflux disease)   . Diverticulosis   . Small bowel perforation (HCC)     in 2003, with abscess and inflammatory mass, path c/w Crohns     Past Surgical History  Procedure Laterality Date  . Mandible fracture surgery    . Wisdom tooth extraction    . Laparoscopic ileocecectomy     Family History  Problem Relation Age of Onset  . Hypertension Father   . Lymphoma Father   . Colon cancer Neg Hx   . Esophageal cancer Neg Hx   . Rectal cancer Neg Hx   . Stomach cancer Neg Hx    Social History  Substance Use Topics  . Smoking status: Never Smoker   . Smokeless tobacco: Never Used  . Alcohol Use: No   Current Outpatient Prescriptions  Medication Sig Dispense Refill  . B Complex-Folic Acid (SUPER B COMPLEX MAXI PO) Take 1 capsule by mouth daily.    . diclofenac (VOLTAREN) 75 MG EC tablet Take 75 mg by mouth 2 (two) times daily.    Marland Kitchen EQL CALCIUM-MAGNESIUM-ZINC PO Take by mouth.    . fenofibrate (TRICOR) 145 MG tablet Take 145 mg by mouth daily.    Marland Kitchen loratadine (CLARITIN) 10 MG tablet Take 10 mg by mouth daily.    . metoprolol tartrate (LOPRESSOR) 12.5 mg TABS tablet Take 12.5 mg by mouth 2 (two) times daily.    Marland Kitchen omeprazole (PRILOSEC) 20 MG capsule Take 1 capsule (20 mg total) by mouth 2 (two) times daily before a meal. 120 capsule 3  . POTASSIUM CHLORIDE PO Take by mouth.     No current facility-administered medications for this visit.   Allergies  Allergen Reactions  . Clarithromycin Other (See Comments)    "out-of-body" experience  . Penicillins     Has patient had  a PCN reaction causing immediate rash, facial/tongue/throat swelling, SOB or lightheadedness with hypotension: Yes Has patient had a PCN reaction causing severe rash involving mucus membranes or skin necrosis: No Has patient had a PCN reaction that required hospitalization No Has patient had a PCN reaction occurring within the last 10 years: No n If all of the above answers are "NO", then may proceed with Cephalosporin use.      Review of Systems: All systems reviewed and negative except where noted in HPI.    Lab Results  Component Value Date   WBC 8.5 10/11/2015   HGB 13.1 10/11/2015   HCT 38.4* 10/11/2015  MCV 82.9 10/11/2015   PLT 334 10/11/2015    Lab Results  Component Value Date   ALT 36 10/10/2015   AST 26 10/10/2015   ALKPHOS 64 10/10/2015   BILITOT 0.5 10/10/2015    Lab Results  Component Value Date   CREATININE 0.79 10/11/2015   BUN 8 10/11/2015   NA 141 10/11/2015   K 3.7 10/11/2015   CL 111 10/11/2015   CO2 24 10/11/2015     Physical Exam: BP 114/88 mmHg  Pulse 86  Ht 5\' 11"  (1.803 m)  Wt 276 lb (125.193 kg)  BMI 38.51 kg/m2 Constitutional: Pleasant,well-developed, male in no acute distress. HEENT: Normocephalic and atraumatic. Conjunctivae are normal. No scleral icterus. Neck supple.  Cardiovascular: Normal rate, regular rhythm.  Pulmonary/chest: Effort normal and breath sounds normal. No wheezing, rales or rhonchi. Abdominal: Soft, nondistended, nontender, protuberant. Bowel sounds active throughout. There are no masses palpable. No hepatomegaly. Extremities: no edema Lymphadenopathy: No cervical adenopathy noted. Neurological: Alert and oriented to person place and time. Skin: Skin is warm and dry. No rashes noted. Psychiatric: Normal mood and affect. Behavior is normal.   ASSESSMENT AND PLAN: 45 y/o male here to discuss the following issue:  History of reported Crohn's / abnormality noted on colonoscopy: history as outlined above.  Surgical pathology very consistent with Crohns disease in 2003, however not on any maintenance therapy since that time and essentially doing well without other than loose stools at times. We performed a colonoscopy since the last visit which showed ulceration at the surgical anastomosis itself but no where else - ddx for this finding includes NSAID related changes, Crohns, vs. Ischemic changes at the anastomosis. I had asked him to stop using NSAIDs, but he continues diclofenac twice daily for chronic back pain. We discussed that long term, if he has Crohns this needs to be monitored given risk of recurrent disease, stricture formation, and perforating disease with his prior history. However, if these changes at the anastomosis do represent Crohns at this time, it would be considered quite mild and it does not appear to be causing symptoms, and in this light he would elect to have continued observation without therapy regardless (after we discussed treatment options for small bowel Crohns).   Moving forward, recommend fecal calprotectin at this time to more objectively evaluate for small bowel inflammation and CBC to ensure no anemia. If it's normal we may just continue to monitor him and consider another colonoscopy in 1-2 years. If his fecal calprotectin is high or he has anemia I would recommend an MR enterography study to further assess for small bowel Crohns. In the interim, I discussed long term risks of NSAIDs with him, and that continued use is confounding his presentation. He understands the risks but states it works best for his pain which is severe, and is going to try tapering off it in the future, and working with his chiropracter for this issue.   He agreed with the plan, all questions answered.   Ileene PatrickSteven Armbruster, MD [facility] Pager [phone]

## 2016-05-03 NOTE — Patient Instructions (Signed)
Your physician has requested that you go to the basement for the following lab work before leaving today: Calprotectin, CBC, BMET  If you are age 45 or older, your body mass index should be between 23-30. Your Body mass index is 38.51 kg/(m^2). If this is out of the aforementioned range listed, please consider follow up with your Primary Care Provider.  If you are age 45 or younger, your body mass index should be between 19-25. Your Body mass index is 38.51 kg/(m^2). If this is out of the aformentioned range listed, please consider follow up with your Primary Care Provider.

## 2016-05-10 ENCOUNTER — Telehealth: Payer: Self-pay | Admitting: Cardiovascular Disease

## 2016-05-10 NOTE — Telephone Encounter (Signed)
Pt calling stating the past Thursday and yesterday while being out in the yard his heart rate spiked up to 170 It took about an hour both times to come back down, he is a bit concerned about this.  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  Was advised last time that he may want to try a CT would like to also know if that is an option.  Please advise.

## 2016-05-10 NOTE — Telephone Encounter (Signed)
Pt sched to come in tomorrow to see Dr. Mariah MillingGollan @ 2:00.

## 2016-05-11 ENCOUNTER — Ambulatory Visit (INDEPENDENT_AMBULATORY_CARE_PROVIDER_SITE_OTHER): Payer: BLUE CROSS/BLUE SHIELD | Admitting: Cardiovascular Disease

## 2016-05-11 ENCOUNTER — Encounter: Payer: Self-pay | Admitting: Cardiovascular Disease

## 2016-05-11 VITALS — BP 124/90 | HR 89 | Ht 70.5 in | Wt 273.5 lb

## 2016-05-11 DIAGNOSIS — R000 Tachycardia, unspecified: Secondary | ICD-10-CM | POA: Diagnosis not present

## 2016-05-11 DIAGNOSIS — I10 Essential (primary) hypertension: Secondary | ICD-10-CM

## 2016-05-11 DIAGNOSIS — E860 Dehydration: Secondary | ICD-10-CM

## 2016-05-11 DIAGNOSIS — Z8249 Family history of ischemic heart disease and other diseases of the circulatory system: Secondary | ICD-10-CM

## 2016-05-11 MED ORDER — METOPROLOL TARTRATE 25 MG PO TABS: 25.0000 mg | ORAL_TABLET | Freq: Two times a day (BID) | ORAL | Status: DC

## 2016-05-11 NOTE — Progress Notes (Signed)
Patient ID: Jeffrey Morales, male   DOB: [birth year], 45 y.o.   MRN: [id number] Cardiology Office Note  Date:  05/11/2016   ID:  Jeffrey GreaserGregory Morales, DOB [birth year], MRN [id number]  PCP:  Zachery DauerMILAM,JAMES T, MD   Chief Complaint  Patient presents with  . other    C/o tachycardia. Meds reviewed verbally with pt.    HPI:  Jeffrey Morales is a pleasant 45 year old gentleman with history of obesity, elevated triglycerides, baseline tachycardia, hypertension who presents For routine follow-up of his tachycardia.  Previously seen at [facility] with symptoms of dehydration, tachycardia . History of partial colectomy in the past, part of small bowel, part of large intestine Works at [employer], stroke coordinator  In follow-up today, he reports having Two episodes of tachycardia, last Thursday and Tuesday On Thursday had finished dinner, went outside to push mild Had heart rate up to 180, down to 140 with slower pace Tried to drink fluids, felt nauseous, vomited. Later heart rate improved Second episode several days later on a Tuesday, heart rate up to 140 He is trying to drink more fluids Previously was only taking metoprolol 12.5 mg twice a day After recent episodes of tachycardia, has increased the metoprolol back to 25 mill grams twice a day with improved heart rate  Monitors his heart rate using wrist fitbit  EKG on today's visit shows normal sinus rhythm with rate 89 bpm, no significant ST or T-wave changes, prolonged QTC  Other past medical history Previous GI bug in 2016, was on HCTZ at the time . He is having significant tachycardia, palpitations . EKG showed a rare APC, PVCs with sinus tachycardia, rate in the 120 range  Magnesium 1.6, potassium 3.4  He was hydrated with improvement of his symptoms  Echocardiogram showed normal LV function , question of mild LVH, otherwise normal study   PMH:   has a past medical history of Essential hypertension; Reflux; Bulging lumbar disc; Hyperlipidemia;  PVC (premature ventricular contraction); Chronic diarrhea; Allergy; GERD (gastroesophageal reflux disease); Diverticulosis; and Small bowel perforation (HCC).  PSH:    Past Surgical History  Procedure Laterality Date  . Mandible fracture surgery    . Wisdom tooth extraction    . Laparoscopic ileocecectomy      Current Outpatient Prescriptions  Medication Sig Dispense Refill  . B Complex-Folic Acid (SUPER B COMPLEX MAXI PO) Take 1 capsule by mouth daily.    . diclofenac (VOLTAREN) 75 MG EC tablet Take 75 mg by mouth 2 (two) times daily.    Marland Kitchen. EQL CALCIUM-MAGNESIUM-ZINC PO Take by mouth.    . fenofibrate (TRICOR) 145 MG tablet Take 145 mg by mouth daily.    Marland Kitchen. loratadine (CLARITIN) 10 MG tablet Take 10 mg by mouth daily.    . metoprolol tartrate (LOPRESSOR) 25 MG tablet Take 1 tablet (25 mg total) by mouth 2 (two) times daily. 180 tablet 4  . omeprazole (PRILOSEC) 20 MG capsule Take 1 capsule (20 mg total) by mouth 2 (two) times daily before a meal. 120 capsule 3  . POTASSIUM CHLORIDE PO Take by mouth.     No current facility-administered medications for this visit.     Allergies:   Clarithromycin and Penicillins   Social History:  The patient  reports that he has never smoked. He has never used smokeless tobacco. He reports that he does not drink alcohol or use illicit drugs.   Family History:   family history includes Hypertension in his father; Lymphoma in his father. There is no history  of Colon cancer, Esophageal cancer, Rectal cancer, or Stomach cancer.    Review of Systems: Review of Systems  Constitutional: Negative.   Respiratory: Negative.   Cardiovascular: Positive for palpitations.       Tachycardia  Gastrointestinal: Negative.   Musculoskeletal: Negative.   Neurological: Negative.   Psychiatric/Behavioral: Negative.   All other systems reviewed and are negative.    PHYSICAL EXAM: VS:  BP 124/90 mmHg  Pulse 89  Ht 5' 10.5" (1.791 m)  Wt 273 lb 8 oz (124.059  kg)  BMI 38.68 kg/m2 , BMI Body mass index is 38.68 kg/(m^2). GEN: Well nourished, well developed, in no acute distress HEENT: normal Neck: no JVD, carotid bruits, or masses Cardiac: RRR; no murmurs, rubs, or gallops,no edema  Respiratory:  clear to auscultation bilaterally, normal work of breathing GI: soft, nontender, nondistended, + BS MS: no deformity or atrophy Skin: warm and dry, no rash Neuro:  Strength and sensation are intact Psych: euthymic mood, full affect    Recent Labs: 10/10/2015: ALT 36 10/11/2015: Magnesium 1.7; TSH 0.715 05/03/2016: BUN 17; Creatinine, Ser 1.05; Hemoglobin 14.9; Platelets 327.0; Potassium 4.1; Sodium 136    Lipid Panel No results found for: CHOL, HDL, LDLCALC, TRIG    Wt Readings from Last 3 Encounters:  05/11/16 273 lb 8 oz (124.059 kg)  05/03/16 276 lb (125.193 kg)  03/14/16 277 lb (125.646 kg)       ASSESSMENT AND PLAN:  Sinus tachycardia (HCC) - Plan: EKG 12-Lead, CT CARDIAC SCORING, Lipid Profile Recent episodes of sinus tachycardia Likely related to hypovolemia, following dinner, Improved with hydration, rest Recommended he continue metoprolol 25 mg twice a day  Family history of premature CAD - Plan: CT CARDIAC SCORING, Lipid Profile We have ordered CT coronary calcium score   Essential hypertension Blood pressure relatively well-controlled on his current medication regiment, metoprolol 25 mg twice a day  Dehydration Prone to periods of dehydration given previous gastrointestinal colon resection Encouraged high fluid intake Avoiding diuretics   Total encounter time more than 15 minutes  Greater than 50% was spent in counseling and coordination of care with the patient   Disposition:   F/U  12 months   Orders Placed This Encounter  Procedures  . CT CARDIAC SCORING  . Lipid Profile  . EKG 12-Lead     Signed, Dossie Arbourim Derryl Uher, M.D., Ph.D. 05/11/2016  [facility],  [place] [phone]

## 2016-05-11 NOTE — Patient Instructions (Addendum)
Medication Instructions:  Please continue metoprolol twice a day Take extra 1/2 as needed  Other B-blockers:  propranolol (4 to 6 hours) 10 mg and 20 mg Bystolic 5, 10, 20 mg  Labwork: We will check lipids   Testing/Procedures: We will order a CT coronary calcium score for tachycardia, family hx Wednesday, July 5 @ 3:00, please arrive @ 2:45 There is a one-time fee of $150 due at the time of your procedure  [address], [place] [address] [phone]  Follow-Up: It was a pleasure seeing you in the office today. Please call us if you have new issues that need to be addressed before your next appt.  [phone]  Your physician wants you to follow-up in: 6 months.  You will receive a reminder letter in the mail two months in advance. If you don't receive a letter, please call our office to schedule the follow-up appointment.  If you need a refill on your cardiac medications before your next appointment, please call your pharmacy.  Coronary Calcium Scan A coronary calcium scan is an imaging test used to look for deposits of calcium and other fatty materials (plaques) in the inner lining of the blood vessels of your heart (coronary arteries). These deposits of calcium and plaques can partly clog and narrow the coronary arteries without producing any symptoms or warning signs. This puts you at risk for a heart attack. This test can detect these deposits before symptoms develop.  LET [facility]YOUR HEALTH CARE PROVIDER KNOW ABOUT:  Any allergies you have.  All medicines you are taking, including vitamins, herbs, eye drops, creams, and over-the-counter medicines.  Previous problems you or members of your family have had with the use of anesthetics.  Any blood disorders you have.  Previous surgeries you have had.  Medical conditions you have.  Possibility of pregnancy, if this applies. RISKS AND COMPLICATIONS Generally, this is a safe procedure. However, as with any procedure, complications  can occur. This test involves the use of radiation. Radiation exposure can be dangerous to a pregnant woman and her unborn baby. If you are pregnant, you should not have this procedure done.  BEFORE THE PROCEDURE There is no special preparation for the procedure. PROCEDURE  You will need to undress and put on a hospital gown. You will need to remove any jewelry around your neck or chest.  Sticky electrodes are placed on your chest and are connected to an electrocardiogram (EKG or electrocardiography) machine to recorda tracing of the electrical activity of your heart.  A CT scanner will take pictures of your heart. During this time, you will be asked to lie still and hold your breath for 2-3 seconds while a picture is being taken of your heart. AFTER THE PROCEDURE   You will be allowed to get dressed.  You can return to your normal activities after the scan is done.   This information is not intended to replace advice given to you by your health care provider. Make sure you discuss any questions you have with your health care provider.   Document Released: 04/27/2008 Document Revised: 11/04/2013 Document Reviewed: 07/07/2013 Elsevier Interactive Patient Education [employer]2016 [employer] Inc.

## 2016-05-12 ENCOUNTER — Other Ambulatory Visit: Payer: BLUE CROSS/BLUE SHIELD

## 2016-05-12 LAB — LIPID PANEL
CHOLESTEROL TOTAL: 173 mg/dL (ref 100–199)
Chol/HDL Ratio: 3.7 ratio units (ref 0.0–5.0)
HDL: 47 mg/dL (ref >39)
LDL Calculated: 98 mg/dL (ref 0–99)
Triglycerides: 138 mg/dL (ref 0–149)
VLDL CHOLESTEROL CAL: 28 mg/dL (ref 5–40)

## 2016-05-17 ENCOUNTER — Ambulatory Visit (INDEPENDENT_AMBULATORY_CARE_PROVIDER_SITE_OTHER)
Admission: RE | Admit: 2016-05-17 | Discharge: 2016-05-17 | Disposition: A | Discharge status: Home / Self Care | Payer: Self-pay | Source: Ambulatory Visit | Attending: Cardiovascular Disease | Admitting: Cardiovascular Disease

## 2016-05-17 DIAGNOSIS — Z8249 Family history of ischemic heart disease and other diseases of the circulatory system: Secondary | ICD-10-CM

## 2016-05-17 DIAGNOSIS — R000 Tachycardia, unspecified: Secondary | ICD-10-CM

## 2016-05-18 LAB — CALPROTECTIN: CALPROTECTIN: 70 ug/g (ref <=162.9)

## 2016-05-19 ENCOUNTER — Encounter: Payer: Self-pay | Admitting: Gastroenterology

## 2016-05-19 ENCOUNTER — Telehealth: Payer: Self-pay | Admitting: Cardiovascular Disease

## 2016-05-19 NOTE — Telephone Encounter (Signed)
If no answer call cell.  Please call with CT calcium score results.

## 2016-05-19 NOTE — Telephone Encounter (Signed)
Please see result note 

## 2016-10-17 ENCOUNTER — Encounter: Payer: Self-pay | Admitting: Gastroenterology

## 2016-10-23 ENCOUNTER — Other Ambulatory Visit: Payer: Self-pay | Admitting: Gastroenterology

## 2016-11-17 ENCOUNTER — Other Ambulatory Visit
Admission: RE | Admit: 2016-11-17 | Discharge: 2016-11-17 | Disposition: A | Discharge status: Home / Self Care | Payer: BLUE CROSS/BLUE SHIELD | Source: Ambulatory Visit | Attending: Cardiovascular Disease | Admitting: Cardiovascular Disease

## 2016-11-17 ENCOUNTER — Ambulatory Visit (INDEPENDENT_AMBULATORY_CARE_PROVIDER_SITE_OTHER): Payer: BLUE CROSS/BLUE SHIELD | Admitting: Cardiovascular Disease

## 2016-11-17 ENCOUNTER — Encounter: Payer: Self-pay | Admitting: Cardiovascular Disease

## 2016-11-17 VITALS — BP 124/82 | HR 83 | Ht 70.0 in | Wt 285.0 lb

## 2016-11-17 DIAGNOSIS — E785 Hyperlipidemia, unspecified: Secondary | ICD-10-CM | POA: Diagnosis present

## 2016-11-17 DIAGNOSIS — E860 Dehydration: Secondary | ICD-10-CM | POA: Insufficient documentation

## 2016-11-17 DIAGNOSIS — E876 Hypokalemia: Secondary | ICD-10-CM | POA: Diagnosis not present

## 2016-11-17 DIAGNOSIS — R000 Tachycardia, unspecified: Secondary | ICD-10-CM

## 2016-11-17 LAB — HEPATIC FUNCTION PANEL
ALBUMIN: 4.6 g/dL (ref 3.5–5.0)
ALT: 34 U/L (ref 17–63)
AST: 23 U/L (ref 15–41)
Alkaline Phosphatase: 48 U/L (ref 38–126)
Bilirubin, Direct: 0.2 mg/dL (ref 0.1–0.5)
Indirect Bilirubin: 1.2 mg/dL — ABNORMAL HIGH (ref 0.3–0.9)
TOTAL PROTEIN: 7.5 g/dL (ref 6.5–8.1)
Total Bilirubin: 1.4 mg/dL — ABNORMAL HIGH (ref 0.3–1.2)

## 2016-11-17 LAB — LIPID PANEL
CHOL/HDL RATIO: 4.3 ratio
CHOLESTEROL: 194 mg/dL (ref 0–200)
HDL: 45 mg/dL (ref >40)
LDL Cholesterol: 112 mg/dL — ABNORMAL HIGH (ref 0–99)
TRIGLYCERIDES: 184 mg/dL — AB (ref <150)
VLDL: 37 mg/dL (ref 0–40)

## 2016-11-17 LAB — BASIC METABOLIC PANEL
Anion gap: 6 (ref 5–15)
BUN: 14 mg/dL (ref 6–20)
CHLORIDE: 106 mmol/L (ref 101–111)
CO2: 25 mmol/L (ref 22–32)
CREATININE: 0.83 mg/dL (ref 0.61–1.24)
Calcium: 9.3 mg/dL (ref 8.9–10.3)
GFR calc Af Amer: >60 mL/min (ref >60)
GFR calc non Af Amer: >60 mL/min (ref >60)
Glucose, Bld: 104 mg/dL — ABNORMAL HIGH (ref 65–99)
POTASSIUM: 4.4 mmol/L (ref 3.5–5.1)
SODIUM: 137 mmol/L (ref 135–145)

## 2016-11-17 LAB — MAGNESIUM: Magnesium: 1.9 mg/dL (ref 1.7–2.4)

## 2016-11-17 MED ORDER — METOPROLOL SUCCINATE ER 50 MG PO TB24
50.0000 mg | ORAL_TABLET | Freq: Every day | ORAL | 3 refills | Status: DC
Start: 2016-11-17 — End: 2017-08-07

## 2016-11-17 NOTE — Progress Notes (Signed)
Cardiology Office Note  Date:  11/17/2016   ID:  Janey GreaserGregory Rakers, DOB August 05, 1971, MRN [id number]  PCP:  Zachery DauerMILAM,JAMES T, MD   Chief Complaint  Patient presents with  . other    6 month follow up. Meds reviewed by the pt. verbally. "doing well."     HPI:  Mr. Carole BinningMeadows is a pleasant 46 year old gentleman with history of obesity, elevated triglycerides, baseline tachycardia, hypertension who presents For routine follow-up of his tachycardia.  Previously [facility] with symptoms of dehydration, tachycardia . History of partial colectomy in the past, part of small bowel, part of large intestine Works at [employer], stroke coordinator Recent CT coronary calcium score of 0  In follow-up he reports feeling well, Has noticed tachycardia in the early evening when he gets home from work as he is taking his evening dose of metoprolol, heart rate up to 120s Typically takes morning dose at 5 AM Wonders if he needs higher dose or longer acting medication  Continues to have some difficulty maintaining hydration given previous GI surgery Requesting lab work today  TriCor discontinued by primary care  EKG on today's visit shows normal sinus rhythm with rate 83 bpm, no significant ST or T-wave changes  Other past medical history reviewed Previous episodes of tachycardia,   finished dinner, went outside , Had heart rate up to 180, down to 140 with slower pace Tried to drink fluids, felt nauseous, vomited. Later heart rate improved Second episode several days later, heart rate up to 140 Previously taking metoprolol 12.5 mg twice a day After  episodes of tachycardia,  increased the metoprolol back to 25 mgrams twice a day   Monitors his heart rate using wrist fitbit  Previous GI bug in 2016, was on HCTZ at the time . He is having significant tachycardia, palpitations . EKG showed a rare APC, PVCs with sinus tachycardia, rate in the 120 range  Magnesium 1.6, potassium 3.4  He was hydrated  with improvement of his symptoms  Echocardiogram showed normal LV function , question of mild LVH, otherwise normal study  PMH:   has a past medical history of Allergy; Bulging lumbar disc; Chronic diarrhea; Diverticulosis; Essential hypertension; GERD (gastroesophageal reflux disease); Hyperlipidemia; PVC (premature ventricular contraction); Reflux; and Small bowel perforation (HCC).  PSH:    Past Surgical History:  Procedure Laterality Date  . LAPAROSCOPIC ILEOCECECTOMY    . MANDIBLE FRACTURE SURGERY    . WISDOM TOOTH EXTRACTION      Current Outpatient Prescriptions  Medication Sig Dispense Refill  . EQL CALCIUM-MAGNESIUM-ZINC PO Take by mouth.    . loratadine (CLARITIN) 10 MG tablet Take 10 mg by mouth daily.    . metoprolol tartrate (LOPRESSOR) 25 MG tablet Take 1 tablet (25 mg total) by mouth 2 (two) times daily. 180 tablet 4  . naproxen sodium (ANAPROX) 220 MG tablet Take 220 mg by mouth 2 (two) times daily with a meal.    . omeprazole (PRILOSEC) 20 MG capsule TAKE ONE CAPSULE BY MOUTH TWICE A DAY BEFORE A MEAL 120 capsule 3  . POTASSIUM CHLORIDE PO Take by mouth.    . metoprolol succinate (TOPROL-XL) 50 MG 24 hr tablet Take 1 tablet (50 mg total) by mouth daily. Take with or immediately following a meal. 90 tablet 3   No current facility-administered medications for this visit.      Allergies:   Clarithromycin and Penicillins   Social History:  The patient  reports that he has never smoked. He has never  used smokeless tobacco. He reports that he does not drink alcohol or use drugs.   Family History:   family history includes Hypertension in his father; Lymphoma in his father.    Review of Systems: Review of Systems  Constitutional: Negative.   Respiratory: Negative.   Cardiovascular: Negative.        Tachycardia  Gastrointestinal: Negative.   Musculoskeletal: Negative.   Neurological: Negative.   Psychiatric/Behavioral: Negative.   All other systems reviewed and  are negative.    PHYSICAL EXAM: VS:  BP 124/82 (BP Location: Left Arm, Patient Position: Sitting, Cuff Size: Large)   Pulse 83   Ht 5\' 10"  (1.778 m)   Wt 285 lb (129.3 kg)   BMI 40.89 kg/m  , BMI Body mass index is 40.89 kg/m. GEN: Well nourished, well developed, in no acute distress, obese  HEENT: normal  Neck: no JVD, carotid bruits, or masses Cardiac: RRR; no murmurs, rubs, or gallops,no edema  Respiratory:  clear to auscultation bilaterally, normal work of breathing GI: soft, nontender, nondistended, + BS MS: no deformity or atrophy  Skin: warm and dry, no rash Neuro:  Strength and sensation are intact Psych: euthymic mood, full affect    Recent Labs: 05/03/2016: BUN 17; Creatinine, Ser 1.05; Hemoglobin 14.9; Platelets 327.0; Potassium 4.1; Sodium 136    Lipid Panel Lab Results  Component Value Date   CHOL 173 05/11/2016   HDL 47 05/11/2016   LDLCALC 98 05/11/2016   TRIG 138 05/11/2016      Wt Readings from Last 3 Encounters:  11/17/16 285 lb (129.3 kg)  05/11/16 273 lb 8 oz (124.1 kg)  05/03/16 276 lb (125.2 kg)       ASSESSMENT AND PLAN:   Hyperlipidemia, unspecified hyperlipidemia type - Plan: Hepatic function panel, Lipid Profile, Magnesium, Basic Metabolic Panel (BMET) Previous lipid panel 6 months ago within reasonable range He is concerned about triglycerides after holding fenofibrate We will recheck today, he is fasting  Hypomagnesemia  We will recheck magnesium level on today's visit  Hypokalemia We will recheck BMP today Trouble hydrating given previous GI surgery  Sinus tachycardia Recommended he change metoprolol tartrate to metoprolol succinate 50 mg in the morning Would use metoprolol tartrate for breakthrough palpitations/tachycardia  Dehydration He is working on aggressive hydration   Total encounter time more than 15 minutes  Greater than 50% was spent in counseling and coordination of care with the patient   Disposition:    F/U  12 months   Orders Placed This Encounter  Procedures  . Hepatic function panel  . Lipid Profile  . Magnesium  . Basic Metabolic Panel (BMET)  . EKG 12-Lead     Signed, Dossie Arbour, M.D., Ph.D. 11/17/2016  [facility] [place], [place] [phone]

## 2016-11-17 NOTE — Patient Instructions (Addendum)
Medication Instructions:   Please stop the metoprolol tartrate, Take as needed  Start metoprolol succinate 50 mg once a day  Labwork:  We will check liver, lipids, magnesium, BMP  Testing/Procedures:  No further testing at this time   I recommend watching educational videos on topics of interest to you at:       [email or web address]  Enter code: HEARTCARE    Follow-Up: It was a pleasure seeing you in the office today. Please call us if you havKoreae new issues that need to be addressed before your next appt.  [phone]  Your physician wants you to follow-up in: 12 months.  You will receive a reminder letter in the mail two months in advance. If you don't receive a letter, please call our office to schedule the follow-up appointment.  If you need a refill on your cardiac medications before your next appointment, please call your pharmacy.

## 2016-12-26 ENCOUNTER — Ambulatory Visit (INDEPENDENT_AMBULATORY_CARE_PROVIDER_SITE_OTHER): Payer: BLUE CROSS/BLUE SHIELD | Admitting: Cardiovascular Disease

## 2016-12-26 ENCOUNTER — Telehealth: Payer: Self-pay | Admitting: Cardiovascular Disease

## 2016-12-26 ENCOUNTER — Ambulatory Visit (INDEPENDENT_AMBULATORY_CARE_PROVIDER_SITE_OTHER): Payer: BLUE CROSS/BLUE SHIELD

## 2016-12-26 ENCOUNTER — Encounter: Payer: Self-pay | Admitting: Cardiovascular Disease

## 2016-12-26 VITALS — BP 159/79 | HR 108 | Ht 71.0 in | Wt 290.2 lb

## 2016-12-26 VITALS — BP 151/105 | HR 105

## 2016-12-26 DIAGNOSIS — Z6830 Body mass index (BMI) 30.0-30.9, adult: Secondary | ICD-10-CM

## 2016-12-26 DIAGNOSIS — I10 Essential (primary) hypertension: Secondary | ICD-10-CM | POA: Diagnosis not present

## 2016-12-26 DIAGNOSIS — E860 Dehydration: Secondary | ICD-10-CM

## 2016-12-26 DIAGNOSIS — E6609 Other obesity due to excess calories: Secondary | ICD-10-CM

## 2016-12-26 DIAGNOSIS — R000 Tachycardia, unspecified: Secondary | ICD-10-CM | POA: Diagnosis not present

## 2016-12-26 DIAGNOSIS — R002 Palpitations: Secondary | ICD-10-CM

## 2016-12-26 DIAGNOSIS — R42 Dizziness and giddiness: Secondary | ICD-10-CM | POA: Diagnosis not present

## 2016-12-26 NOTE — Progress Notes (Signed)
Cardiology Office Note  Date:  12/26/2016   ID:  Jeffrey Morales, DOB 1971-02-01, MRN [id number]  PCP:  Jeffrey Dauer, MD   Chief Complaint  Patient presents with  . other    Early f/u per pt call c/o elevated HR/dizziness today. Pt c/o sob/upper respiratory for about 3 days/lump in throat. Reviewed meds with pt verbally.    HPI:  Jeffrey Morales is a pleasant 46 year old gentleman with history of obesity, elevated triglycerides, baseline tachycardia, hypertension who presents For routine follow-up of his tachycardia.  Previously seen at [facility] with symptoms of dehydration, tachycardia . 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 Echo 09/2015: normal, EF 60%  Recently suffering from upper respiratory tract infection, still with heavy coughing We received a phone call today while he was at a meeting in [facility], after heavy coughing, heart rate up to 160 sustained   he monitored heart rate, walked down to the emergency room  Heart rate in the 140 range  Drink lots of water , then drove home  Took extra metoprolol tartrate when he got home Mild improvement of heart rate We called him for EKG, he drove into the office, evaluated today by myself  Heart rate still in the low 100 range on EKG, sinus tachycardia, no significant ST or T-wave changes  Orthostatics done in the office with increase in heart rate from 105 up to 116 with standing, drop in blood pressure from 160/110 down to 131/99 with standing   reports having less loose bowel movements, following Weight Watchers Felt his mouth was dry this morning HR typically 80 to 90 before virus, Reports he had been doing relatively well on metoprolol succinate 50 mg daily, occasionally takes metoprolol tartrate for breakthrough tachycardia   Continues to have some difficulty maintaining hydration given previous GI  surgery  EKG on today's visit shows normal sinus rhythm with rate 108 bpm, no significant ST or T-wave changes  Other past medical history reviewed Previous episodes of tachycardia,   over the summer of 2017, finished dinner, went outside , Had heart rate up to 180, down to 140 with slower pace (has started drinking coffee) Tried to drink fluids, felt nauseous, vomited. Later heart rate improved Second episode several days later, heart rate up to 140 Previously taking metoprolol 12.5 mg twice a day After  episodes of tachycardia,  increased the metoprolol back to 25 mgrams twice a day   Monitors his heart rate using wrist fitbit  Previous GI bug in 2016, was on HCTZ at the time . He is having significant tachycardia, palpitations . EKG showed a rare APC, PVCs with sinus tachycardia, rate in the 120 range  Magnesium 1.6, potassium 3.4  He was hydrated with improvement of his symptoms  Echocardiogram showed normal LV function , question of mild LVH, otherwise normal study  PMH:   has a past medical history of Allergy; Bulging lumbar disc; Chronic diarrhea; Diverticulosis; Essential hypertension; GERD (gastroesophageal reflux disease); Hyperlipidemia; PVC (premature ventricular contraction); Reflux; and Small bowel perforation (HCC).  PSH:    Past Surgical History:  Procedure Laterality Date  . LAPAROSCOPIC ILEOCECECTOMY    . MANDIBLE FRACTURE SURGERY    . WISDOM TOOTH EXTRACTION      Current Outpatient Prescriptions  Medication Sig Dispense Refill  . EQL CALCIUM-MAGNESIUM-ZINC PO Take by mouth.    . loratadine (CLARITIN) 10 MG tablet  Take 10 mg by mouth daily.    . metoprolol succinate (TOPROL-XL) 50 MG 24 hr tablet Take 1 tablet (50 mg total) by mouth daily. Take with or immediately following a meal. 90 tablet 3  . metoprolol tartrate (LOPRESSOR) 25 MG tablet Take 1 tablet (25 mg total) by mouth 2 (two) times daily. 180 tablet 4  . naproxen sodium (ANAPROX) 220 MG tablet  Take 220 mg by mouth 2 (two) times daily with a meal.    . omeprazole (PRILOSEC) 20 MG capsule TAKE ONE CAPSULE BY MOUTH TWICE A DAY BEFORE A MEAL 120 capsule 3  . POTASSIUM CHLORIDE PO Take by mouth.     No current facility-administered medications for this visit.      Allergies:   Clarithromycin and Penicillins   Social History:  The patient  reports that he has never smoked. He has never used smokeless tobacco. He reports that he does not drink alcohol or use drugs.   Family History:   family history includes Hypertension in his father; Lymphoma in his father.    Review of Systems: Review of Systems  Constitutional: Negative.   Respiratory: Negative.   Cardiovascular: Negative.        Tachycardia  Gastrointestinal: Negative.   Musculoskeletal: Negative.   Neurological: Positive for dizziness.  Psychiatric/Behavioral: Negative.   All other systems reviewed and are negative.    PHYSICAL EXAM: VS:  BP (!) 151/105 (BP Location: Left Arm, Patient Position: Sitting, Cuff Size: Normal)   Pulse (!) 105  , BMI There is no height or weight on file to calculate BMI. GEN: Well nourished, well developed, in no acute distress , obese HEENT: normal  Neck: no JVD, carotid bruits, or masses Cardiac:  regular rhythm, tachycardia,  no murmurs, rubs, or gallops,no edema  Respiratory:  clear to auscultation bilaterally, normal work of breathing GI: soft, nontender, nondistended, + BS MS: no deformity or atrophy  Skin: warm and dry, no rash Neuro:  Strength and sensation are intact Psych: euthymic mood, full affect    Recent Labs: 05/03/2016: Hemoglobin 14.9; Platelets 327.0 11/17/2016: ALT 34; BUN 14; Creatinine, Ser 0.83; Magnesium 1.9; Potassium 4.4; Sodium 137    Lipid Panel Lab Results  Component Value Date   CHOL 194 11/17/2016   HDL 45 11/17/2016   LDLCALC 112 (H) 11/17/2016   TRIG 184 (H) 11/17/2016      Wt Readings from Last 3 Encounters:  12/26/16 290 lb 4 oz (131.7  kg)  11/17/16 285 lb (129.3 kg)  05/11/16 273 lb 8 oz (124.1 kg)       ASSESSMENT AND PLAN:  Essential hypertension Blood pressure mildly elevated today, likely secondary to underlying viral infectional Recommended we monitor blood pressure for now  Dehydration  recommended he continue to push water   Sinus tachycardia  significant sinus tachycardia, less likely atrial tachycardia though unable to exclude atrial tachycardia  . My initial worried today was that he may have been in atrial flutter with rate 160 , though likely sinus tachycardia in the setting of cough and upper respiratory tract infection .  Previously reached 180 bpm on other episodes  Recommended he discuss his tachycardia with Dr. Graciela HusbandsKlein . I suspect his tachycardia is exacerbated by his hydration  (worse with dehydration ). Prior to recent upper respiratory infection, rate was 80-90 on metoprolol succinate . He is currently going to take extra metoprolol tartrate for breakthrough tachycardia .  Class 1 obesity due to excess calories without serious comorbidity with  body mass index (BMI) of 30.0 to 30.9 in adult  he is doing Weight Watchers    Total encounter time more than 15 minutes  Greater than 50% was spent in counseling and coordination of care with the patient   Disposition:   F/U  12 months  No orders of the defined types were placed in this encounter.    Signed, Dossie Arbour, M.D., Ph.D. 12/26/2016  [facility] [place], [place] [phone]

## 2016-12-26 NOTE — Progress Notes (Signed)
Pt reports that he has had URI and sinusitis that seems to be resolving. He has not taken any abx, OTC zinc lozenges, and Cold-eez. He was in a meeting @ [facility] when he started coughing and felt lightheaded. He got up to excuse himself and found his HR to be 161. He used to work @ [employer], so he went down to the ED and talked to the staff. His mouth was dry so he drank some water - his dizziness resolved and his HR came down to 150. He takes metoprolol succinate 50 mg daily; he went home to take metoprolol tartrate 25 mg. While sitting down and driving his car, his HR is 113. He will stay at home for a bit to see if his HR will come down a bit more and wait to see what Dr. Mariah MillingGollan recommends he do. His EKG showed sinus tachycardia, rate fluctuates b/t 74-108. He c/o dizziness when HR is elevated and would like to see Dr. Mariah MillingGollan.  Added pt on to see Dr. Mariah MillingGollan today @ 2:00.

## 2016-12-26 NOTE — Patient Instructions (Addendum)
Medication Instructions:   No medication changes made  Please take extra metoprolol as needed   Labwork:  No new labs needed  Testing/Procedures:  No further testing at this time   I recommend watching educational videos on topics of interest to you at:       [email or web address]  Enter code: HEARTCARE    Follow-Up: It was a pleasure seeing you in the office today. Please call us if you have new issues that need to be addressed before your next appt.  [phone]  Your physician wants you to follow-up in: 6 months.  You will receive a reminder letter in the mail two months in advance. If you don't receive a letter, please call our office to schedule the follow-up appointment.  If you need a refill on your cardiac medications before your next appointment, please call your pharmacy.

## 2016-12-26 NOTE — Telephone Encounter (Signed)
Spoke w/ pt.  He reports that he has had URI and sinusitis that seems to be resolving. He has not taken any abx, OTC zinc lozenges, and Cold-eez. He was in a meeting [facility] when he started coughing and felt lightheaded. He got up to excuse himself and found his HR to be 161. He used to work @ [employer], so he went down to the ED and talked to the staff. His mouth was dry so he drank some water - his dizziness resolved and his HR came down to 150. He takes metoprolol succinate 50 mg daily; he went home to take metoprolol tartrate 25 mg. While sitting down and driving his car, his HR is 113. He will stay at home for a bit to see if his HR will come down a bit more and wait to see what Dr. Mariah MillingGollan recommends he do. He is agreeable to coming in for EKG if needed or to see Dr. Mariah MillingGollan, though he does report his HR is regular.  Advised him that I will try to speak w/ Dr. Mariah MillingGollan between clinic pts and call him back w/ his recommendation.

## 2016-12-26 NOTE — Telephone Encounter (Signed)
Pt calling stating he was just in a meeting  And he started to get dizzy so he took a walk and his HR was really high It was 160 and sitting now (he is going home) it is still a bit high  He is worried about this  Please call

## 2016-12-26 NOTE — Telephone Encounter (Signed)
Spoke w/ pt.  Advised him that Dr. Mariah MillingGollan would like to get an EKG to see what rhythm he is in.  He did not get an [facility] at [facility].  He will come over now for nurse visit.

## 2016-12-27 ENCOUNTER — Ambulatory Visit: Payer: Self-pay | Admitting: Cardiovascular Disease

## 2017-01-07 ENCOUNTER — Encounter: Payer: Self-pay | Admitting: Cardiovascular Disease

## 2017-01-18 ENCOUNTER — Ambulatory Visit: Payer: Self-pay | Admitting: Internal Medicine

## 2017-02-08 ENCOUNTER — Encounter: Payer: Self-pay | Admitting: Internal Medicine

## 2017-02-08 ENCOUNTER — Ambulatory Visit (INDEPENDENT_AMBULATORY_CARE_PROVIDER_SITE_OTHER): Payer: BLUE CROSS/BLUE SHIELD | Admitting: Internal Medicine

## 2017-02-08 VITALS — BP 140/100 | HR 98 | Ht 71.0 in | Wt 287.0 lb

## 2017-02-08 DIAGNOSIS — R000 Tachycardia, unspecified: Secondary | ICD-10-CM | POA: Diagnosis not present

## 2017-02-08 DIAGNOSIS — G4730 Sleep apnea, unspecified: Secondary | ICD-10-CM | POA: Diagnosis not present

## 2017-02-08 NOTE — Patient Instructions (Signed)
Medication Instructions: - Your physician recommends that you continue on your current medications as directed. Please refer to the Current Medication list given to you today.  Labwork: - none ordered  Procedures/Testing: - Your physician has recommended that you wear a 24 hour holter monitor. Holter monitors are medical devices that record the heart's electrical activity. Doctors most often use these monitors to diagnose arrhythmias. Arrhythmias are problems with the speed or rhythm of the heartbeat. The monitor is a small, portable device. You can wear one while you do your normal daily activities. This is usually used to diagnose what is causing palpitations/syncope (passing out).  - Your physician has recommended that you have a home sleep study. This test records several body functions during sleep, including: brain activity, eye movement, oxygen and carbon dioxide blood levels, heart rate and rhythm, breathing rate and rhythm, the flow of air through your mouth and nose, snoring, body muscle movements, and chest and belly movement.- the nurse from the pulmonary department will call to schedule.  Follow-Up: - Your physician recommends that you schedule a follow-up appointment in: 4-6 weeks   Any Additional Special Instructions Will Be Listed Below (If Applicable).     If you need a refill on your cardiac medications before your next appointment, please call your pharmacy.

## 2017-02-08 NOTE — Progress Notes (Signed)
ELECTROPHYSIOLOGY CONSULT NOTE  Patient ID: Jeffrey Morales, MRN: [id number], DOB/AGE: 46/31/1972 45 y.o. Admit date: (Not on file) Date of Consult: 02/08/2017  Primary Physician: Zachery Dauer, MD Primary Cardiologist: TG Consulting Physician TG  Chief Complaint: tachycardia and palpitations   HPI Jeffrey Morales is a 46 y.o. male  Referred because of palpitations as well as tachycardia. 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 That having been said, review of his BUN/creatinine ratios have never been all that abnormal.  He has a history of hypertension. Cardiac evaluation has included a calcium score that was 0 echocardiogram with normal LV function and mild LVH  Multiple ECGs 11/16--2/18 were personally reviewed. Interestingly, heart rates less than 90 were associated with a positive/negative P-wave in lead V1;  heart rates faster than 100 were associated with a negative P-wave in lead V1; he has not had a Holter monitor  His monitor when he was hospitalized 11/16 were notable for PVCs and PACs. He himself has noted of late that PPI therapy has been associated with aggravation of his palpitations although not affecting his tachycardia.  He denies sleep disorder breathing but has some fatigue.  He has a history of a small bowel resection related to perforation and pathology consistent with Crohn's  Past Medical History:  Diagnosis Date  . Allergy   . Bulging lumbar disc    L1-S1  . Chronic diarrhea   . Diverticulosis   . Essential hypertension   . GERD (gastroesophageal reflux disease)   . Hyperlipidemia   . PVC (premature ventricular contraction)    no problems currently  . Reflux   . Small bowel perforation (HCC)    in  2003, with abscess and inflammatory mass, path c/w Crohns      Surgical History:  Past Surgical History:  Procedure Laterality Date  . LAPAROSCOPIC ILEOCECECTOMY    . MANDIBLE FRACTURE SURGERY    . WISDOM TOOTH EXTRACTION       Home Meds: Prior to Admission medications   Medication Sig Start Date End Date Taking? Authorizing Provider  EQL CALCIUM-MAGNESIUM-ZINC PO Take by mouth.   Yes Historical Provider, MD  loratadine (CLARITIN) 10 MG tablet Take 10 mg by mouth daily as needed.    Yes Historical Provider, MD  metoprolol succinate (TOPROL-XL) 50 MG 24 hr tablet Take 1 tablet (50 mg total) by mouth daily. Take with or immediately following a meal. 11/17/16 11/17/17 Yes Antonieta Iba, MD  metoprolol tartrate (LOPRESSOR) 25 MG tablet Take 1 tablet (25 mg total) by mouth 2 (two) times daily. 05/11/16  Yes Antonieta Iba, MD  naproxen sodium (ANAPROX) 220 MG tablet Take 220 mg by mouth 2 (two) times daily with a meal.   Yes Historical Provider, MD  POTASSIUM CHLORIDE PO Take by mouth.   Yes Historical Provider, MD  ranitidine (ZANTAC) 150 MG tablet Take 150 mg by mouth as needed for heartburn.   Yes Historical Provider, MD    Allergies:  Allergies  Allergen Reactions  . Clarithromycin Other (See Comments)    "out-of-body" experience  . Penicillins     Has patient had a PCN reaction causing immediate rash, facial/tongue/throat swelling, SOB or lightheadedness with hypotension: Yes Has patient had  a PCN reaction causing severe rash involving mucus membranes or skin necrosis: No Has patient had a PCN reaction that required hospitalization No Has patient had a PCN reaction occurring within the last 10 years: No n If all of the above answers are "NO", then may proceed with Cephalosporin use.     Social History   Social History  . Marital status: Married    Spouse name: N/A  . Number of children: 2  . Years of education: N/A   Occupational History  . 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 Smoking status: Never Smoker  . Smokeless tobacco: Never Used  . Alcohol use No  . Drug use: No  . Sexual activity: Not on file   Other Topics Concern  . Not on file   Social History Narrative  . No narrative on file     Family History  Problem Relation Age of Onset  . Hypertension Father   . Lymphoma Father   . Colon cancer Neg Hx   . Esophageal cancer Neg Hx   . Rectal cancer Neg Hx   . Stomach cancer Neg Hx     ROS:  Please see the history of present illness.     All other systems reviewed and negative.    Physical Exam: Blood pressure (!) 140/100, pulse 98, height 5\' 11"  (1.803 m), weight 287 lb (130.2 kg). General: Well developed, obese male in no acute distress. Head: Normocephalic, atraumatic, sclera non-icteric, no xanthomas, nares are without discharge. EENT: normal  Lymph Nodes:  none Neck: Negative for carotid bruits. JVD not elevated. Back:without scoliosis kyphosis Lungs: Clear bilaterally to auscultation without wheezes, rales, or rhonchi. Breathing is unlabored. Heart: RRR with S1 S2. No  murmur . No rubs, or gallops appreciated. Abdomen: Soft, non-tender, non-distended with normoactive bowel sounds. No hepatomegaly. No rebound/guarding. No obvious abdominal masses. Msk:  Strength and tone appear normal for age. Extremities: No clubbing or cyanosis. No  edema.  Distal pedal pulses are 2+ and equal bilaterally. Skin: Warm and Dry Neuro: Alert and oriented X 3. CN III-XII intact Grossly normal sensory and motor function . Psych:  Responds to questions appropriately with a normal affect.      Labs: Cardiac Enzymes No results for input(s): CKTOTAL, CKMB, TROPONINI in the last 72 hours. CBC Lab Results  Component Value Date   WBC 8.2 05/03/2016   HGB 14.9 05/03/2016   HCT 44.1 05/03/2016   MCV 83.4 05/03/2016   PLT 327.0 05/03/2016   PROTIME: No results for input(s): LABPROT, INR in  the last 72 hours. Chemistry No results for input(s): NA, K, CL, CO2, BUN, CREATININE, CALCIUM, PROT, BILITOT, ALKPHOS, ALT, AST, GLUCOSE in the last 168 hours.  Invalid input(s): LABALBU Lipids Lab Results  Component Value Date   CHOL 194 11/17/2016   HDL 45 11/17/2016   LDLCALC 112 (H) 11/17/2016   TRIG 184 (H) 11/17/2016   BNP No results found for: PROBNP Thyroid Function Tests: No results for input(s): TSH, T4TOTAL, T3FREE, THYROIDAB in the last 72 hours.  Invalid input(s): FREET3 Miscellaneous Lab Results  Component Value Date   DDIMER 0.44 10/10/2015    Radiology/Studies:  No results found.  EKG: Sinus rhythm at 98 Intervals 16/08/33/-42 Nonspecific T-wave flattening   Assessment and Plan:  PACs/PVCs  Sinus tachycardia--CRO atrial tachycardia  Hypertension  Hypertensive heart disease  Obesity  Sleep disordered breathing  Small/large bowel resection-partial   The patient  has PACs and PVCs which she has identified as being responsive to the discontinuation of his PPI therapy. I will have to explore the mechanism of this. In any case he is feeling much better.  His sinus tachycardia may be an atrial tachycardia. As noted above there is a change in the P wave morphology but it is not clear whether this is related to placement or not. I have told him to insist that he is V1/V2 electrodes are placed in the fourth intercostal space so as to being taken consistency this will help us understand whether his more rapid rates are a different focus from sinus potentially amenable to ablation if necessary  There is also interaction between bowel surgeries and autonomic dysfunction. I wonder whether some of his tachycardia may not relate to his bowel surgery. There is at this point no clear evidence of LV dysfunction. It will be important to note whether he has nocturnal bradycardia that would provide cardiac arrest. We will undertake a 24-hour Holter monitor.  Given his  hypertension, I've encouraged him to pursue a sleep study. He has sleep disordered breathing.   Sherryl MangesSteven Taffie Eckmann

## 2017-02-09 ENCOUNTER — Ambulatory Visit: Payer: Self-pay | Admitting: Internal Medicine

## 2017-02-12 ENCOUNTER — Ambulatory Visit (INDEPENDENT_AMBULATORY_CARE_PROVIDER_SITE_OTHER): Payer: BLUE CROSS/BLUE SHIELD

## 2017-02-12 DIAGNOSIS — R000 Tachycardia, unspecified: Secondary | ICD-10-CM | POA: Diagnosis not present

## 2017-02-13 ENCOUNTER — Telehealth: Payer: Self-pay | Admitting: *Deleted

## 2017-02-13 ENCOUNTER — Other Ambulatory Visit: Payer: Self-pay

## 2017-02-13 DIAGNOSIS — G4730 Sleep apnea, unspecified: Secondary | ICD-10-CM

## 2017-02-13 DIAGNOSIS — G4733 Obstructive sleep apnea (adult) (pediatric): Secondary | ICD-10-CM | POA: Diagnosis not present

## 2017-02-13 NOTE — Telephone Encounter (Signed)
Pt's sleep study results have been scanned in that Dr. Graciela Husbands ordered. Your office can order the auto CPAP or you can refer pt to [place] and we will proceed from there with his results. Pt has not been informed by our office since we didn't order the study. Let me know if we can be of assistance.

## 2017-02-13 NOTE — Telephone Encounter (Signed)
Will forward to Dr. Klein to review. 

## 2017-02-14 NOTE — Telephone Encounter (Signed)
Dr. Graciela Husbands is referring this pt to [place] for sleep. Will you please place him on the schedule for 02/21/17 @ 9:30am with Simonds and put sleep study done in comments. Pt is aware. Thanks.

## 2017-02-14 NOTE — Telephone Encounter (Signed)
If you could help with tiration followup that would be great

## 2017-02-16 ENCOUNTER — Ambulatory Visit
Admission: RE | Admit: 2017-02-16 | Discharge: 2017-02-16 | Disposition: A | Discharge status: Home / Self Care | Payer: BLUE CROSS/BLUE SHIELD | Source: Ambulatory Visit | Attending: Internal Medicine | Admitting: Internal Medicine

## 2017-02-16 DIAGNOSIS — I10 Essential (primary) hypertension: Secondary | ICD-10-CM | POA: Diagnosis present

## 2017-02-16 DIAGNOSIS — R000 Tachycardia, unspecified: Secondary | ICD-10-CM | POA: Diagnosis present

## 2017-02-21 ENCOUNTER — Ambulatory Visit (INDEPENDENT_AMBULATORY_CARE_PROVIDER_SITE_OTHER): Payer: BLUE CROSS/BLUE SHIELD | Admitting: Pulmonary Disease

## 2017-02-21 ENCOUNTER — Encounter: Payer: Self-pay | Admitting: Pulmonary Disease

## 2017-02-21 VITALS — BP 138/86 | HR 90 | Wt 281.0 lb

## 2017-02-21 DIAGNOSIS — G4733 Obstructive sleep apnea (adult) (pediatric): Secondary | ICD-10-CM

## 2017-02-21 NOTE — Patient Instructions (Addendum)
AutoSet CPAP with nasal mask has been ordered. Follow-up in 8 weeks to assess compliance. Continue your excellent efforts on weight loss with dietary changes and exercise

## 2017-02-21 NOTE — Progress Notes (Signed)
PULMONARY CONSULT NOTE  Requesting MD/Service: Berton Mount, MD Date of initial consultation: 02/21/17 Reason for consultation: OSA  PT PROFILE: 46 y.o. male never smoker referred for evaluation of a new diagnosis of obstructive sleep apnea  DATA: Home PSG 02/08/17: AHI 19.5 per hour. 17 obstructive apneas, 19 central apneas, 14 mixed apneas, 88 hypopneas. Recommended auto titrating CPAP with pressure settings of 5-20 cm H2O  HPI:  As above. He was initially evaluated by cardiology (Dr. Mariah Milling, then Dr. Graciela Husbands) for tachycardia and hypertension. This evaluation included a Holter monitor. Ultimately, the concern was raised for sleep apnea. He underwent a home sleep study with results as above. He is referred for further evaluation and management. He describes only mild daytime hypersomnolence. His wife reports only mild snoring. He does note frequent nocturnal awakenings without clear reason why. He has no difficulty falling back asleep. He has gained approximately 50 pounds in the last few years. His Epworth sleepiness scale score is 2.  Past Medical History:  Diagnosis Date  . Allergy   . Bulging lumbar disc    L1-S1  . Chronic diarrhea   . Diverticulosis   . Essential hypertension   . GERD (gastroesophageal reflux disease)   . Hyperlipidemia   . PVC (premature ventricular contraction)    no problems currently  . Reflux   . Small bowel perforation (HCC)    in 2003, with abscess and inflammatory mass, path c/w Crohns    Past Surgical History:  Procedure Laterality Date  . LAPAROSCOPIC ILEOCECECTOMY    . MANDIBLE FRACTURE SURGERY    . WISDOM TOOTH EXTRACTION      MEDICATIONS: I have reviewed all medications and confirmed regimen as documented  Social History   Social History  . Marital status: Married    Spouse name: N/A  . Number of children: 2  . Years of education: N/A   Occupational History  . 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 Smoking status: Never Smoker  . Smokeless tobacco: Never Used  . Alcohol use No  . Drug use: No  . Sexual activity: Not on file   Other Topics Concern  . Not on file   Social History Narrative  . No narrative on file    Family History  Problem Relation Age of Onset  . Hypertension Father   . Lymphoma Father   . Colon cancer Neg Hx   . Esophageal cancer Neg Hx   . Rectal cancer Neg Hx   . Stomach cancer Neg Hx     ROS: No fever, myalgias/arthralgias, unexplained weight loss or weight gain No new focal weakness or sensory deficits No otalgia, hearing loss, visual changes, nasal and sinus symptoms, mouth and throat problems No neck pain or adenopathy No abdominal pain, N/V/D, diarrhea, change in bowel pattern No dysuria, change in urinary pattern   Vitals:   02/21/17 0940  BP: 138/86  Pulse: 90  SpO2: 99%  Weight: 127.5 kg (281 lb)     EXAM:  Gen: Obese, pleasant, No overt distress HEENT: NCAT, sclera white, oropharynx normal, minimal rhinitis  Neck: Supple without LAN, thyromegaly, JVD Lungs: breath sounds full, percussion normal, adventitious sounds: None  Cardiovascular: RRR, no murmurs noted Abdomen: Soft, nontender, normal BS Ext: without clubbing, cyanosis, edema Neuro: CNs grossly intact, motor and sensory intact Skin: Limited exam, no lesions noted  DATA:   BMP Latest Ref Rng & Units 11/17/2016 05/03/2016 10/11/2015  Glucose 65 -  99 mg/dL 161(W) 91 960(A)  BUN 6 - 20 mg/dL Creatinine 0.61 - 1.24 mg/dL 5.40 9.81 1.91  Sodium 135 - 145 mmol/L 137 136 141  Potassium 3.5 - 5.1 mmol/L 4.4 4.1 3.7  Chloride 101 - 111 mmol/L 106 104 111  CO2 22 - 32 mmol/L Calcium 8.9 - 10.3 mg/dL 9.3 9.5 4.7(W)    CBC Latest Ref Rng & Units 05/03/2016 10/11/2015 10/10/2015  WBC 4.0 - 10.5 K/uL 8.2 8.5 9.8  Hemoglobin 13.0 - 17.0 g/dL 29.5 62.1 30.8  Hematocrit 39.0 - 52.0 % 44.1 38.4(L) 41.5  Platelets 150.0 - 400.0 K/uL 327.0 334 348     CXR:  No recent film  IMPRESSION:     ICD-9-CM ICD-10-CM   1. Obstructive sleep apnea 327.23 G47.33 Ambulatory Referral for DME    PLAN:  Autoset CPAP 5-20 cm H2O ordered Follow-up in 8 weeks  Billy Fischer, MD PCCM service Mobile [phone] Pager [phone] 02/22/2017

## 2017-03-01 ENCOUNTER — Ambulatory Visit: Payer: Self-pay | Admitting: Internal Medicine

## 2017-03-29 ENCOUNTER — Encounter: Payer: Self-pay | Admitting: Pulmonary Disease

## 2017-04-05 ENCOUNTER — Ambulatory Visit (INDEPENDENT_AMBULATORY_CARE_PROVIDER_SITE_OTHER): Payer: BLUE CROSS/BLUE SHIELD | Admitting: Internal Medicine

## 2017-04-05 ENCOUNTER — Encounter: Payer: Self-pay | Admitting: Internal Medicine

## 2017-04-05 VITALS — BP 138/88 | HR 78 | Ht 70.0 in | Wt 280.5 lb

## 2017-04-05 DIAGNOSIS — G4730 Sleep apnea, unspecified: Secondary | ICD-10-CM | POA: Diagnosis not present

## 2017-04-05 DIAGNOSIS — R000 Tachycardia, unspecified: Secondary | ICD-10-CM | POA: Diagnosis not present

## 2017-04-05 DIAGNOSIS — I10 Essential (primary) hypertension: Secondary | ICD-10-CM | POA: Diagnosis not present

## 2017-04-05 DIAGNOSIS — I493 Ventricular premature depolarization: Secondary | ICD-10-CM | POA: Diagnosis not present

## 2017-04-05 DIAGNOSIS — I491 Atrial premature depolarization: Secondary | ICD-10-CM

## 2017-04-05 NOTE — Patient Instructions (Signed)
Medication Instructions: - Your physician recommends that you continue on your current medications as directed. Please refer to the Current Medication list given to you today.  Labwork: - none ordered  Procedures/Testing: - none ordered  Follow-Up: - Dr. Klein will see you back on an as needed basis.  Any Additional Special Instructions Will Be Listed Below (If Applicable).     If you need a refill on your cardiac medications before your next appointment, please call your pharmacy.   

## 2017-04-05 NOTE — Progress Notes (Signed)
Patient Care Team: Zachery DauerMilam, James T, MD as PCP - General (Family Medicine) Mariah MillingGollan, Tollie Pizzaimothy J, MD as Consulting Physician (Cardiology)   HPI  Jeffrey Morales is a 46 y.o. male Seen in follow-up for palpitations with documented PVCs and PACs as well as tachycardia. There is some questions as to the mechanism of the tachycardia with subtle changes in the P wave morphology in lead V1 with more rapid rates.  He has been significantly improved. He is exercising again without sense of impending doom.  He is on CPAP. He has not noticed significant impact on daytime fatigue  Records and Results Reviewed Sleep study demonstrated an AHI of 19.5 and CPAP was recommended Holter monitor 4/18 demonstrated heart rate 55--148/mean 83; symptoms were associated with sinus    Past Medical History:  Diagnosis Date  . Allergy   . Bulging lumbar disc    L1-S1  . Chronic diarrhea   . Diverticulosis   . Essential hypertension   . GERD (gastroesophageal reflux disease)   . Hyperlipidemia   . PVC (premature ventricular contraction)    no problems currently  . Reflux   . Small bowel perforation (HCC)    in 2003, with abscess and inflammatory mass, path c/w Crohns    Past Surgical History:  Procedure Laterality Date  . LAPAROSCOPIC ILEOCECECTOMY    . MANDIBLE FRACTURE SURGERY    . WISDOM TOOTH EXTRACTION      Current Outpatient Prescriptions  Medication Sig Dispense Refill  . EQL CALCIUM-MAGNESIUM-ZINC PO Take by mouth.    . loratadine (CLARITIN) 10 MG tablet Take 10 mg by mouth daily as needed.     . metoprolol succinate (TOPROL-XL) 50 MG 24 hr tablet Take 1 tablet (50 mg total) by mouth daily. Take with or immediately following a meal. (Patient taking differently: Take 50 mg by mouth daily as needed. Take with or immediately following a meal.) 90 tablet 3  . naproxen sodium (ANAPROX) 220 MG tablet Take 220 mg by mouth 2 (two) times daily with a meal.    . POTASSIUM CHLORIDE PO Take by  mouth.    . ranitidine (ZANTAC) 150 MG tablet Take 150 mg by mouth as needed for heartburn.     No current facility-administered medications for this visit.     Allergies  Allergen Reactions  . Clarithromycin Other (See Comments)    "out-of-body" experience  . Penicillins     Has patient had a PCN reaction causing immediate rash, facial/tongue/throat swelling, SOB or lightheadedness with hypotension: Yes Has patient had a PCN reaction causing severe rash involving mucus membranes or skin necrosis: No Has patient had a PCN reaction that required hospitalization No Has patient had a PCN reaction occurring within the last 10 years: No n If all of the above answers are "NO", then may proceed with Cephalosporin use.       Review of Systems negative except from HPI and PMH  Physical Exam BP 138/88 (BP Location: Left Arm, Patient Position: Sitting, Cuff Size: Normal)   Pulse 78   Ht 5\' 10"  (1.778 m)   Wt 280 lb 8 oz (127.2 kg)   BMI 40.25 kg/m  Well developed and nourished in no acute distress HENT normal Neck supple with JVP-flat Carotids brisk and full without bruits Clear Regular rate and rhythm, no murmurs or gallops Abd-soft with active BS without hepatomegaly No Clubbing cyanosis edema Skin-warm and dry A & Oriented  Grossly normal sensory and motor function   ECG  demonstrates sinus rhythm at 78 Intervals 15/07/36 Otherwise normal  Assessment and  Plan  PACs/PVCs  Sinus tachycardia--CRO atrial tachycardia  Hypertension  Hypertensive heart disease  Obesity  Sleep disordered breathing  Small/large bowel resection-partial  At this point there is no suggestion of a primary arrhythmia causing the tachycardia. He is aware of and no longer has discombobulated by the PACs and PVCs. He is exercising and working on losing weight. At this juncture, I would let this take its course before I added more medication for his elevated blood pressure. Will defer this  to Dr. Knute Neu going forward. We will see him as needed    Current medicines are reviewed at length with the patient today .  The patient does not  have concerns regarding medicines.

## 2017-05-08 ENCOUNTER — Ambulatory Visit (INDEPENDENT_AMBULATORY_CARE_PROVIDER_SITE_OTHER): Payer: BLUE CROSS/BLUE SHIELD | Admitting: Pulmonary Disease

## 2017-05-08 ENCOUNTER — Encounter: Payer: Self-pay | Admitting: Pulmonary Disease

## 2017-05-08 VITALS — BP 138/98 | HR 80 | Ht 70.0 in | Wt 285.0 lb

## 2017-05-08 DIAGNOSIS — G4733 Obstructive sleep apnea (adult) (pediatric): Secondary | ICD-10-CM

## 2017-05-08 DIAGNOSIS — Z6841 Body Mass Index (BMI) 40.0 and over, adult: Secondary | ICD-10-CM

## 2017-05-08 NOTE — Patient Instructions (Signed)
Continue CPAP We discussed weight loss strategies. A good resource on line is [employer] Follow-up in 6 months with a target weight of 260#

## 2017-05-08 NOTE — Progress Notes (Signed)
PULMONARY/SLEEP OFFICE FOLLOW UP NOTE  Requesting MD/Service: Berton MountSteve Klein, MD Date of initial consultation: 02/21/17 Reason for consultation: OSA  PT PROFILE: 46 y.o. male never smoker referred for evaluation of a new diagnosis of obstructive sleep apnea  DATA: Home PSG 02/08/17: AHI 19.5 per hour. 17 obstructive apneas, 19 central apneas, 14 mixed apneas, 88 hypopneas. Recommended auto titrating CPAP with pressure settings of 5-20 cm H2O CPAP compliance 05/26-06/24/18: Usage 28/30 days. Greater than 4 hours-21 days  SUBJ:  Is a routine reevaluation. He has no new complaints. He is wearing his CPAP compliantly. However, recently took a trip during which she did not use the CPAP. He cannot tell significant improvement in daytime sleepiness which was only mild to begin with. He might have fewer nocturnal awakenings than previously.   Vitals:   05/08/17 0846  BP: (!) 138/98  Pulse: 80  SpO2: 98%  Weight: 285 lb (129.3 kg)  Height: 5\' 10"  (1.778 m)     EXAM:  Gen: Obese, NAD HEENT: WNL Neck: Supple without JVD Lungs: breath sounds full without adventitious sounds  Cardiovascular: Regular, no murmurs noted Abdomen: Soft, nontender, normal BS Ext: without clubbing, cyanosis, edema Neuro: Grossly intact  DATA:   BMP Latest Ref Rng & Units 11/17/2016 05/03/2016 10/11/2015  Glucose 65 - 99 mg/dL 098(J104(H) 91 191(Y101(H)  BUN 6 - 20 mg/dL 14 17 8   Creatinine 0.61 - 1.24 mg/dL 7.820.83 9.561.05 2.130.79  Sodium 135 - 145 mmol/L 137 136 141  Potassium 3.5 - 5.1 mmol/L 4.4 4.1 3.7  Chloride 101 - 111 mmol/L 106 104 111  CO2 22 - 32 mmol/L 25 28 24   Calcium 8.9 - 10.3 mg/dL 9.3 9.5 0.8(M8.5(L)    CBC Latest Ref Rng & Units 05/03/2016 10/11/2015 10/10/2015  WBC 4.0 - 10.5 K/uL 8.2 8.5 9.8  Hemoglobin 13.0 - 17.0 g/dL 57.814.9 46.913.1 62.914.6  Hematocrit 39.0 - 52.0 % 44.1 38.4(L) 41.5  Platelets 150.0 - 400.0 K/uL 327.0 334 348    CXR:    IMPRESSION:     ICD-10-CM   1. OSA (obstructive sleep apnea) G47.33    2. Class 3 severe obesity E66.01    Z68.41     PLAN:  Continue CPAP, AutoSet We discussed weight loss strategies - I suggested a restricted carbohydrate approach Follow-up in 6 months  Billy Fischeravid Simonds, MD PCCM service Mobile [phone] Pager [phone] 05/08/2017

## 2017-08-01 ENCOUNTER — Encounter: Payer: Self-pay | Admitting: Cardiovascular Disease

## 2017-08-07 ENCOUNTER — Other Ambulatory Visit: Payer: Self-pay | Admitting: Cardiovascular Disease

## 2017-08-07 MED ORDER — METOPROLOL SUCCINATE ER 100 MG PO TB24: 100.0000 mg | ORAL_TABLET | Freq: Every day | ORAL | 3 refills | Status: DC

## 2017-08-08 ENCOUNTER — Other Ambulatory Visit: Payer: Self-pay | Admitting: *Deleted

## 2017-08-08 MED ORDER — METOPROLOL SUCCINATE ER 100 MG PO TB24: 100.0000 mg | ORAL_TABLET | Freq: Every day | ORAL | 3 refills | Status: DC

## 2018-02-07 DIAGNOSIS — C4491 Basal cell carcinoma of skin, unspecified: Secondary | ICD-10-CM

## 2018-02-07 HISTORY — DX: Basal cell carcinoma of skin, unspecified: C44.91

## 2018-05-02 ENCOUNTER — Encounter: Payer: Self-pay | Admitting: Cardiovascular Disease

## 2018-05-02 ENCOUNTER — Ambulatory Visit (INDEPENDENT_AMBULATORY_CARE_PROVIDER_SITE_OTHER): Payer: BLUE CROSS/BLUE SHIELD | Admitting: Cardiovascular Disease

## 2018-05-02 VITALS — BP 135/90 | HR 73 | Ht 71.0 in | Wt 277.0 lb

## 2018-05-02 DIAGNOSIS — I10 Essential (primary) hypertension: Secondary | ICD-10-CM | POA: Diagnosis not present

## 2018-05-02 DIAGNOSIS — Z9049 Acquired absence of other specified parts of digestive tract: Secondary | ICD-10-CM

## 2018-05-02 DIAGNOSIS — R000 Tachycardia, unspecified: Secondary | ICD-10-CM | POA: Diagnosis not present

## 2018-05-02 MED ORDER — METOPROLOL SUCCINATE ER 100 MG PO TB24: 100.0000 mg | ORAL_TABLET | Freq: Every day | ORAL | 4 refills | Status: DC

## 2018-05-02 NOTE — Patient Instructions (Signed)

## 2018-05-02 NOTE — Progress Notes (Signed)
Cardiology Office Note  Date:  05/02/2018   ID:  Jeffrey Morales, DOB [birth year], MRN [id number]  PCP:  Jeffrey Dauer, MD   Chief Complaint  Patient presents with  . Other    12 month follow up. Patient denies chest pain and SOB. Meds reviewed verbally with patient.     HPI:  Jeffrey Morales is a pleasant 47 year old gentleman with history of  obesity,  elevated triglycerides,  Prior history of tachycardia,  Previously seen at [facility] with symptoms of dehydration, tachycardia . History of partial colectomy in the past, part of small bowel, part of large intestine previous difficulty maintaining hydration given previous GI surgery  CT coronary calcium score of 0 Echo 09/2015: normal, EF 60% hypertension who presents For routine follow-up of his tachycardia and hypertension  In follow-up today reports he feels well with no complaints Exercising on treadmill daily Weight down 8 pounds from prior clinic visit Reports heart rate is well controlled On high-dose metoprolol  Lab work reviewed from primary care LDL 89 total cholesterol 171  EKG personally reviewed by myself on todays visit hows normal sinus rhythm with rate 73 bpm no significant ST or T-wave changes  Other past medical history reviewed Previous episodes of tachycardia,   over the summer of 2017, finished dinner, went outside , Had heart rate up to 180, down to 140 with slower pace (has started drinking coffee) Tried to drink fluids, felt nauseous, vomited. Later heart rate improved Second episode several days later, heart rate up to 140 Previously taking metoprolol 12.5 mg twice a day After  episodes of tachycardia,  increased the metoprolol back to 25 mgrams twice a day   Previous GI bug in 2016, was on HCTZ at the time . He is having significant tachycardia, palpitations . EKG showed a rare APC, PVCs with sinus tachycardia, rate in the 120 range  Magnesium 1.6, potassium 3.4  He was hydrated with  improvement of his symptoms  Echocardiogram showed normal LV function , question of mild LVH, otherwise normal study  PMH:   has a past medical history of Allergy, Bulging lumbar disc, Chronic diarrhea, Diverticulosis, Essential hypertension, GERD (gastroesophageal reflux disease), Hyperlipidemia, PVC (premature ventricular contraction), Reflux, and Small bowel perforation (HCC).  PSH:    Past Surgical History:  Procedure Laterality Date  . LAPAROSCOPIC ILEOCECECTOMY    . MANDIBLE FRACTURE SURGERY    . WISDOM TOOTH EXTRACTION      Current Outpatient Medications  Medication Sig Dispense Refill  . b complex vitamins tablet Take 1 tablet by mouth daily.    Marland Kitchen EQL CALCIUM-MAGNESIUM-ZINC PO Take by mouth.    . metoprolol succinate (TOPROL-XL) 100 MG 24 hr tablet Take 1 tablet (100 mg total) by mouth daily. Take with or immediately following a meal. 90 tablet 3  . naproxen sodium (ANAPROX) 220 MG tablet Take 220 mg by mouth 2 (two) times daily with a meal.    . POTASSIUM CHLORIDE PO Take by mouth.     No current facility-administered medications for this visit.      Allergies:   Clarithromycin and Penicillins   Social History:  The patient  reports that he has never smoked. He has never used smokeless tobacco. He reports that he does not drink alcohol or use drugs.   Family History:   family history includes Hypertension in his father; Lymphoma in his father.    Review of Systems: Review of Systems  Constitutional: Negative.   Respiratory: Negative.   Cardiovascular:  Negative.        Tachycardia  Gastrointestinal: Negative.   Musculoskeletal: Negative.   Neurological: Negative.   Psychiatric/Behavioral: Negative.   All other systems reviewed and are negative.    PHYSICAL EXAM: VS:  BP 135/90 (BP Location: Left Arm, Patient Position: Sitting, Cuff Size: Normal)   Pulse 73   Ht 5\' 11"  (1.803 m)   Wt 277 lb (125.6 kg)   BMI 38.63 kg/m  , BMI Body mass index is 38.63  kg/m. Constitutional:  oriented to person, place, and time. No distress.  HENT:  Head: Normocephalic and atraumatic.  Eyes:  no discharge. No scleral icterus.  Neck: Normal range of motion. Neck supple. No JVD present.  Cardiovascular: Normal rate, regular rhythm, normal heart sounds and intact distal pulses. Exam reveals no gallop and no friction rub. No edema No murmur heard. Pulmonary/Chest: Effort normal and breath sounds normal. No stridor. No respiratory distress.  no wheezes.  no rales.  no tenderness.  Abdominal: Soft.  no distension.  no tenderness.  Musculoskeletal: Normal range of motion.  no  tenderness or deformity.  Neurological:  normal muscle tone. Coordination normal. No atrophy Skin: Skin is warm and dry. No rash noted. not diaphoretic.  Psychiatric:  normal mood and affect. behavior is normal. Thought content normal.    Recent Labs: No results found for requested labs within last 8760 hours.    Lipid Panel Lab Results  Component Value Date   CHOL 194 11/17/2016   HDL 45 11/17/2016   LDLCALC 112 (H) 11/17/2016   TRIG 184 (H) 11/17/2016      Wt Readings from Last 3 Encounters:  05/02/18 277 lb (125.6 kg)  05/08/17 285 lb (129.3 kg)  04/05/17 280 lb 8 oz (127.2 kg)       ASSESSMENT AND PLAN:  Essential hypertension Blood pressure is well controlled on today's visit. No changes made to the medications.  Dehydration secondary to colectomy  continue aggressive hydration  Sinus tachycardia Markedly better on his current dose of metoprolol Refill sent in  Class 1 obesity due to excess calories without serious comorbidity with body mass index (BMI) of 30.0 to 30.9 in adult Down 8 pounds, exercising on a regular basis   Total encounter time more than 15 minutes  Greater than 50% was spent in counseling and coordination of care with the patient   Disposition:   F/U  12 months as needed  No orders of the defined types were placed in this  encounter.    Signed, Dossie Arbourim , M.D., Ph.D. 05/02/2018  [facility] [place] [phone]

## 2018-05-08 NOTE — Addendum Note (Signed)
Addended by: Margrett RudSLAYTON, BRITTANY N on: 05/08/2018 09:38 AM   Modules accepted: Orders

## 2018-05-09 ENCOUNTER — Telehealth: Payer: Self-pay | Admitting: Pulmonary Disease

## 2018-05-09 NOTE — Telephone Encounter (Signed)
3 attempts to schedule fu appt from recall list.   Patient declines and states he no longer needs pulmonologist.  Deleting Recall.

## 2019-07-23 ENCOUNTER — Other Ambulatory Visit: Payer: Self-pay

## 2019-07-23 MED ORDER — METOPROLOL SUCCINATE ER 100 MG PO TB24: 100.0000 mg | ORAL_TABLET | Freq: Every day | ORAL | 0 refills | Status: DC

## 2019-07-24 ENCOUNTER — Telehealth: Payer: Self-pay | Admitting: Cardiovascular Disease

## 2019-07-24 NOTE — Telephone Encounter (Signed)
Virtual Visit Pre-Appointment Phone Call  "(Name), I am calling you today to discuss your upcoming appointment. We are currently trying to limit exposure to the virus that causes COVID-19 by seeing patients at home rather than in the office."  1. "What is the BEST phone number to call the day of the visit?" - include this in appointment notes  2. Do you have or have access to (through a family member/friend) a smartphone with video capability that we can use for your visit?" a. If yes - list this number in appt notes as cell (if different from BEST phone #) and list the appointment type as a VIDEO visit in appointment notes b. If no - list the appointment type as a PHONE visit in appointment notes  3. Confirm consent - "In the setting of the current Covid19 crisis, you are scheduled for a (phone or video) visit with your provider on (date) at (time).  Just as we do with many in-office visits, in order for you to participate in this visit, we must obtain consent.  If you'd like, I can send this to your mychart (if signed up) or email for you to review.  Otherwise, I can obtain your verbal consent now.  All virtual visits are billed to your insurance company just like a normal visit would be.  By agreeing to a virtual visit, we'd like you to understand that the technology does not allow for your provider to perform an examination, and thus may limit your provider's ability to fully assess your condition. If your provider identifies any concerns that need to be evaluated in person, we will make arrangements to do so.  Finally, though the technology is pretty good, we cannot assure that it will always work on either your or our end, and in the setting of a video visit, we may have to convert it to a phone-only visit.  In either situation, we cannot ensure that we have a secure connection.  Are you willing to proceed?" STAFF: Did the patient verbally acknowledge consent to telehealth visit? Document  YES/NO here: YES  4. Advise patient to be prepared - "Two hours prior to your appointment, go ahead and check your blood pressure, pulse, oxygen saturation, and your weight (if you have the equipment to check those) and write them all down. When your visit starts, your provider will ask you for this information. If you have an Apple Watch or Kardia device, please plan to have heart rate information ready on the day of your appointment. Please have a pen and paper handy nearby the day of the visit as well."  5. Give patient instructions for MyChart download to smartphone OR [email or web address] as below if video visit (depending on what platform provider is using)  6. Inform patient they will receive a phone call 15 minutes prior to their appointment time (may be from unknown caller ID) so they should be prepared to answer    TELEPHONE CALL NOTE  Jeffrey Morales Roscoe has been deemed a candidate for a follow-up tele-health visit to limit community exposure during the Covid-19 pandemic. I spoke with the patient via phone to ensure availability of phone/video source, confirm preferred email & phone number, and discuss instructions and expectations.  I reminded Jeffrey Morales Kehoe to be prepared with any vital sign and/or heart rhythm information that could potentially be obtained via home monitoring, at the time of his visit. I reminded Jeffrey Morales Carbary to expect a phone call prior to his visit.  Saunders Glance Bumgarner 07/24/2019 9:22 AM   INSTRUCTIONS FOR DOWNLOADING THE MYCHART APP TO SMARTPHONE  - The patient must first make sure to have activated MyChart and know their login information - If Apple, go to [employer] and type in MyChart in the search bar and download the app. If Android, ask patient to go to [employer] and type in [place] in the search bar and download the app. The app is free but as with any other app downloads, their phone may require them to verify saved payment information or  Apple/Android password.  - The patient will need to then log into the app with their MyChart username and password, and select [facility] as their healthcare provider to link the account. When it is time for your visit, go to the MyChart app, find appointments, and click Begin Video Visit. Be sure to Select Allow for your device to access the Microphone and Camera for your visit. You will then be connected, and your provider will be with you shortly.  **If they have any issues connecting, or need assistance please contact MyChart service desk [phone])**  **If using a computer, in order to ensure the best quality for their visit they will need to use either of the following Internet Browsers: [employer], or Google Chrome**  IF USING DOXIMITY or DOXY.ME - The patient will receive a link just prior to their visit by text.     FULL LENGTH CONSENT FOR TELE-HEALTH VISIT   I hereby voluntarily request, consent and authorize [facility] and its employed or contracted physicians, physician assistants, nurse practitioners or other licensed health care professionals (the Practitioner), to provide me with telemedicine health care services (the Services") as deemed necessary by the treating Practitioner. I acknowledge and consent to receive the Services by the Practitioner via telemedicine. I understand that the telemedicine visit will involve communicating with the Practitioner through live audiovisual communication technology and the disclosure of certain medical information by electronic transmission. I acknowledge that I have been given the opportunity to request an in-person assessment or other available alternative prior to the telemedicine visit and am voluntarily participating in the telemedicine visit.  I understand that I have the right to withhold or withdraw my consent to the use of telemedicine in the course of my care at any time, without affecting my right to future care  or treatment, and that the Practitioner or I may terminate the telemedicine visit at any time. I understand that I have the right to inspect all information obtained and/or recorded in the course of the telemedicine visit and may receive copies of available information for a reasonable fee.  I understand that some of the potential risks of receiving the Services via telemedicine include:   Delay or interruption in medical evaluation due to technological equipment failure or disruption;  Information transmitted may not be sufficient (e.g. poor resolution of images) to allow for appropriate medical decision making by the Practitioner; and/or   In rare instances, security protocols could fail, causing a breach of personal health information.  Furthermore, I acknowledge that it is my responsibility to provide information about my medical history, conditions and care that is complete and accurate to the best of my ability. I acknowledge that Practitioner's advice, recommendations, and/or decision may be based on factors not within their control, such as incomplete or inaccurate data provided by me or distortions of diagnostic images or specimens that may result from electronic transmissions. I understand that the  practice of medicine is not an Chief Strategy Officer and that Practitioner makes no warranties or guarantees regarding treatment outcomes. I acknowledge that I will receive a copy of this consent concurrently upon execution via email to the email address I last provided but may also request a printed copy by calling the office of [facility].    I understand that my insurance will be billed for this visit.   I have read or had this consent read to me.  I understand the contents of this consent, which adequately explains the benefits and risks of the Services being provided via telemedicine.   I have been provided ample opportunity to ask questions regarding this consent and the Services and have had  my questions answered to my satisfaction.  I give my informed consent for the services to be provided through the use of telemedicine in my medical care  By participating in this telemedicine visit I agree to the above.

## 2019-08-05 NOTE — Progress Notes (Signed)
Virtual Visit via Video Note   This visit type was conducted due to national recommendations for restrictions regarding the COVID-19 Pandemic (e.g. social distancing) in an effort to limit this patient's exposure and mitigate transmission in our community.  Due to his co-morbid illnesses, this patient is at least at moderate risk for complications without adequate follow up.  This format is felt to be most appropriate for this patient at this time.  All issues noted in this document were discussed and addressed.  A limited physical exam was performed with this format.  Please refer to the patient's chart for his consent to telehealth for [facility].   I connected with  Jeffrey Morales on 08/06/19 by a video enabled telemedicine application and verified that I am speaking with the correct person using two identifiers. I discussed the limitations of evaluation and management by telemedicine. The patient expressed understanding and agreed to proceed.   Evaluation Performed:  Follow-up visit  Date:  08/06/2019   ID:  Jeffrey Morales, DOB 06-10-1971, MRN [id number]  Patient Location:  [address] [place] [address]   Provider location:   [facility], [place] office  PCP:  Zachery Dauer, MD  Cardiologist:  Hubbard Robinson [facility]   Chief Complaint:  palpitations    History of Present Illness:    Jeffrey Morales is a 48 y.o. male who presents via audio/video conferencing for a telehealth visit today.   The patient does not symptoms concerning for COVID-19 infection (fever, chills, cough, or new SHORTNESS OF BREATH).   Patient has a past medical history of obesity,  elevated triglycerides,  Prior history of tachycardia,  Previously seen at [facility] with symptoms of dehydration, tachycardia . History of partial colectomy in the past, part of small bowel, part of large intestine previous difficulty maintaining hydration given previous GI surgery  CT coronary  calcium score of 0 Echo 09/2015: normal, EF 60% hypertension who presents For routine follow-up of his tachycardia and hypertension  No recent labs Having palpitations,  Heart rate >100 at dinner With walking eaily 125  Rate now sitting 99  BP 118-130/80-95  Lots of meeting Rare atypical chest pain  Weight up less treadmill  Prior labs LDL 89 total cholesterol 171  Other past medical history reviewed Previousepisodes of tachycardia,  over the summer of 2017, finished dinner, went outside , Had heart rate up to 180, down to 140 with slower pace (has started drinking coffee) Tried to drink fluids, felt nauseous, vomited. Later heart rate improved Second episode several days later, heart rate up to 140 Previously taking metoprolol 12.5 mg twice a day After episodes of tachycardia, increased the metoprolol back to 25 mgrams twice a day   Previous GI bug in 2016, was on HCTZ at the time . He is having significant tachycardia, palpitations . EKG showed a rare APC, PVCs with sinus tachycardia, rate in the 120 range  Magnesium 1.6, potassium 3.4  He was hydrated with improvement of his symptoms  Echocardiogram showed normal LV function , question of mild LVH, otherwise normal study    Prior CV studies:   The following studies were reviewed today:    Past Medical History:  Diagnosis Date  . Allergy   . Bulging lumbar disc    L1-S1  . Chronic diarrhea   . Diverticulosis   . Essential hypertension   . GERD (gastroesophageal reflux disease)   . Hyperlipidemia   . PVC (premature ventricular contraction)  no problems currently  . Reflux   . Small bowel perforation ([place])    in 2003, with abscess and inflammatory mass, path c/w Crohns   Past Surgical History:  Procedure Laterality Date  . LAPAROSCOPIC ILEOCECECTOMY    . MANDIBLE FRACTURE SURGERY    . WISDOM TOOTH EXTRACTION        Allergies:   Clarithromycin and Penicillins   Social History   Tobacco  Use  . Smoking status: Never Smoker  . Smokeless tobacco: Never Used  Substance Use Topics  . Alcohol use: No    Alcohol/week: 0.0 standard drinks  . Drug use: No     Current Outpatient Medications on File Prior to Visit  Medication Sig Dispense Refill  . b complex vitamins tablet Take 1 tablet by mouth daily.    . calcium-vitamin D (OSCAL WITH D) 500-200 MG-UNIT tablet Take 1 tablet by mouth daily with breakfast.    . EQL CALCIUM-MAGNESIUM-ZINC PO Take by mouth.    . loratadine (CLARITIN) 10 MG tablet Take 10 mg by mouth daily.    . metoprolol succinate (TOPROL-XL) 100 MG 24 hr tablet Take 1 tablet (100 mg total) by mouth daily. Take with or immediately following a meal. 90 tablet 0  . metoprolol tartrate (LOPRESSOR) 25 MG tablet Take 25 mg by mouth 2 (two) times daily as needed.    . Multiple Vitamin (MULTIVITAMIN) tablet Take 1 tablet by mouth daily.    . naproxen sodium (ANAPROX) 220 MG tablet Take 220 mg by mouth daily as needed.     Marland Kitchen POTASSIUM CHLORIDE PO Take by mouth.    . ranitidine (ZANTAC) 150 MG capsule Take 150 mg by mouth every evening.    . Choline Fenofibrate (FENOFIBRIC ACID) 135 MG CPDR Take 1 capsule by mouth daily.     No current facility-administered medications on file prior to visit.      Family Hx: The patient's family history includes Hypertension in his father; Lymphoma in his father. There is no history of Colon cancer, Esophageal cancer, Rectal cancer, or Stomach cancer.  ROS:   Please see the history of present illness.    Review of Systems  Constitutional: Negative.   HENT: Negative.   Respiratory: Negative.   Cardiovascular: Negative.   Gastrointestinal: Negative.   Musculoskeletal: Negative.   Neurological: Negative.   Psychiatric/Behavioral: Negative.   All other systems reviewed and are negative.     Labs/Other Tests and Data Reviewed:    Recent Labs: No results found for requested labs within last 8760 hours.   Recent Lipid Panel  Lab Results  Component Value Date/Time   CHOL 194 11/17/2016 01:04 PM   CHOL 173 05/11/2016 03:06 PM   TRIG 184 (H) 11/17/2016 01:04 PM   HDL 45 11/17/2016 01:04 PM   HDL 47 05/11/2016 03:06 PM   CHOLHDL 4.3 11/17/2016 01:04 PM   LDLCALC 112 (H) 11/17/2016 01:04 PM   LDLCALC 98 05/11/2016 03:06 PM    Wt Readings from Last 3 Encounters:  08/06/19 285 lb (129.3 kg)  05/02/18 277 lb (125.6 kg)  05/08/17 285 lb (129.3 kg)     Exam:    Vital Signs: Vital signs may also be detailed in the HPI BP 122/81   Pulse 64   Ht 5\' 11"  (1.803 m)   Wt 285 lb (129.3 kg)   BMI 39.75 kg/m   Wt Readings from Last 3 Encounters:  08/06/19 285 lb (129.3 kg)  05/02/18 277 lb (125.6 kg)  05/08/17 285  lb (129.3 kg)   Temp Readings from Last 3 Encounters:  03/14/16 99.6 F (37.6 C) (Tympanic)  10/11/15 97.8 F (36.6 C) (Oral)  10/05/15 98 F (36.7 C)   BP Readings from Last 3 Encounters:  08/06/19 122/81  05/02/18 135/90  05/08/17 (!) 138/98   Pulse Readings from Last 3 Encounters:  08/06/19 64  05/02/18 73  05/08/17 80     Well nourished, well developed male in no acute distress. Constitutional:  oriented to person, place, and time. No distress.  Head: Normocephalic and atraumatic.  Eyes:  no discharge. No scleral icterus.  Neck: Normal range of motion. Neck supple.  Pulmonary/Chest: No audible wheezing, no distress, appears comfortable Musculoskeletal: Normal range of motion.  no  tenderness or deformity.  Neurological:   Coordination normal. Full exam not performed Skin:  No rash Psychiatric:  normal mood and affect. behavior is normal. Thought content normal.    ASSESSMENT & PLAN:    Problem List Items Addressed This Visit      Cardiology Problems   Hypertension   Relevant Medications   metoprolol tartrate (LOPRESSOR) 25 MG tablet   Choline Fenofibrate (FENOFIBRIC ACID) 135 MG CPDR     Other   Sinus tachycardia - Primary     Essential hypertension Blood pressure  stable, numbers discussed with him We will increase metoprolol for rate control as detailed below  Dehydration secondary to colectomy  continue aggressive hydration Recommend he stay hydrated  Sinus tachycardia We will increase the metoprolol Stay on metoprolol succinate 100 in the morning Add additional 25 up to 50 mg metoprolol succinate also in the morning for better rate control  Class 1 obesity due to excess calories without serious comorbidity with body mass index (BMI) of 30.0 to 30.9 in adult Weight trending upwards, more work, eating more, snacking, less exercise Lab work ordered at his request   COVID-19 Education: The signs and symptoms of COVID-19 were discussed with the patient and how to seek care for testing (follow up with PCP or arrange E-visit).  The importance of social distancing was discussed today.  Patient Risk:   After full review of this patients clinical status, I feel that they are at least moderate risk at this time.  Time:   Today, I have spent 25 minutes with the patient with telehealth technology discussing the cardiac and medical problems/diagnoses detailed above   Additional 10 min spent reviewing the chart prior to patient visit today   Medication Adjustments/Labs and Tests Ordered: Current medicines are reviewed at length with the patient today.  Concerns regarding medicines are outlined above.   Tests Ordered: No tests ordered   Medication Changes: No changes made   Disposition: Follow-up in 12 months   Signed, Julien Nordmann, MD  [facility] [address], [place], [place] [address]

## 2019-08-06 ENCOUNTER — Other Ambulatory Visit: Payer: Self-pay

## 2019-08-06 ENCOUNTER — Telehealth: Payer: Self-pay

## 2019-08-06 ENCOUNTER — Telehealth: Payer: Self-pay | Admitting: Cardiovascular Disease

## 2019-08-06 ENCOUNTER — Encounter: Payer: Self-pay | Admitting: Cardiovascular Disease

## 2019-08-06 ENCOUNTER — Telehealth (INDEPENDENT_AMBULATORY_CARE_PROVIDER_SITE_OTHER): Payer: BC Managed Care – PPO | Admitting: Cardiovascular Disease

## 2019-08-06 VITALS — BP 122/81 | HR 64 | Ht 71.0 in | Wt 285.0 lb

## 2019-08-06 DIAGNOSIS — R000 Tachycardia, unspecified: Secondary | ICD-10-CM | POA: Diagnosis not present

## 2019-08-06 DIAGNOSIS — I10 Essential (primary) hypertension: Secondary | ICD-10-CM | POA: Diagnosis not present

## 2019-08-06 DIAGNOSIS — Z9049 Acquired absence of other specified parts of digestive tract: Secondary | ICD-10-CM | POA: Diagnosis not present

## 2019-08-06 DIAGNOSIS — R002 Palpitations: Secondary | ICD-10-CM

## 2019-08-06 MED ORDER — METOPROLOL SUCCINATE ER 100 MG PO TB24: 100.0000 mg | ORAL_TABLET | Freq: Every day | ORAL | 3 refills | Status: DC

## 2019-08-06 MED ORDER — METOPROLOL SUCCINATE ER 50 MG PO TB24: 50.0000 mg | ORAL_TABLET | Freq: Every day | ORAL | 3 refills | Status: DC

## 2019-08-06 MED ORDER — FENOFIBRIC ACID 135 MG PO CPDR: 1.0000 | DELAYED_RELEASE_CAPSULE | Freq: Every day | ORAL | 3 refills | Status: DC

## 2019-08-06 NOTE — Patient Instructions (Addendum)
Sent order slip in mail : CMP, lipids, CBC  Medication Instructions:  Additional script for metoprolol succinate 50 mg daily, 90 day with refills In addition to 100 mg daily metoprolol, #90,  [facility] [place]  If you need a refill on your cardiac medications before your next appointment, please call your pharmacy.    Lab work: No new labs needed   If you have labs (blood work) drawn today and your tests are completely normal, you will receive your results only by: Marland Kitchen MyChart Message (if you have MyChart) OR . A paper copy in the mail If you have any lab test that is abnormal or we need to change your treatment, we will call you to review the results.   Testing/Procedures: No new testing needed   Follow-Up: At [facility], you and your health needs are our priority.  As part of our continuing mission to provide you with exceptional heart care, we have created designated Provider Care Teams.  These Care Teams include your primary Cardiologist (physician) and Advanced Practice Providers (APPs -  Physician Assistants and Nurse Practitioners) who all work together to provide you with the care you need, when you need it.  . You will need a follow up appointment in 12 months .   Please call our office 2 months in advance to schedule this appointment.    . Providers on your designated Care Team:   . Murray Hodgkins, NP . Christell Faith, PA-C . Marrianne Mood, PA-C  Any Other Special Instructions Will Be Listed Below (If Applicable).  For educational health videos Log in to : [email or web address] Or : [email or web address], password : [email or web address]

## 2019-08-06 NOTE — Telephone Encounter (Signed)
Returned the call to [place] [facility] Confirmed that Dr.Gollan wants the patient to have an Rx for an additional 50mg  qd of Metoprolol.  Per Dr.Gollan's 08/06/19 notes: Sinus tachycardia We will increase the metoprolol Stay on metoprolol succinate 100 in the morning Add additional 25 up to 50 mg metoprolol succinate also in the morning for better rate control  Juliann Pulse verbalized understanding and voiced appreciation for the call.

## 2019-08-06 NOTE — Telephone Encounter (Signed)
[facility]iann P[facility] [place], [place] calling Would like to clarify a medication change  States patient was on metoprolol 100 MG but a new prescription came in for metoprolol 50 MG Please call to clarify at [phone]

## 2019-08-06 NOTE — Telephone Encounter (Signed)
Written script for labwork (cmp, lipid, cbc) mailed to the patient. Results are to be faxed back to our office attn: Dr.Gollan

## 2019-08-14 ENCOUNTER — Other Ambulatory Visit: Payer: Self-pay | Admitting: Cardiovascular Disease

## 2019-08-15 ENCOUNTER — Encounter: Payer: Self-pay | Admitting: Cardiovascular Disease

## 2019-08-15 LAB — LIPID PANEL W/O CHOL/HDL RATIO
Cholesterol, Total: 235 mg/dL — ABNORMAL HIGH (ref 100–199)
HDL: 43 mg/dL (ref >39)
LDL Chol Calc (NIH): 99 mg/dL (ref 0–99)
Triglycerides: 556 mg/dL (ref 0–149)
VLDL Cholesterol Cal: 93 mg/dL — ABNORMAL HIGH (ref 5–40)

## 2019-08-15 LAB — COMPREHENSIVE METABOLIC PANEL
ALT: 28 IU/L (ref 0–44)
AST: 19 IU/L (ref 0–40)
Albumin/Globulin Ratio: 1.7 (ref 1.2–2.2)
Albumin: 4.5 g/dL (ref 4.0–5.0)
Alkaline Phosphatase: 65 IU/L (ref 39–117)
BUN/Creatinine Ratio: 16 (ref 9–20)
BUN: 13 mg/dL (ref 6–24)
Bilirubin Total: 0.4 mg/dL (ref 0.0–1.2)
CO2: 22 mmol/L (ref 20–29)
Calcium: 9.5 mg/dL (ref 8.7–10.2)
Chloride: 101 mmol/L (ref 96–106)
Creatinine, Ser: 0.83 mg/dL (ref 0.76–1.27)
GFR calc Af Amer: 121 mL/min/{1.73_m2} (ref >59)
GFR calc non Af Amer: 105 mL/min/{1.73_m2} (ref >59)
Globulin, Total: 2.7 g/dL (ref 1.5–4.5)
Glucose: 96 mg/dL (ref 65–99)
Potassium: 4.5 mmol/L (ref 3.5–5.2)
Sodium: 138 mmol/L (ref 134–144)
Total Protein: 7.2 g/dL (ref 6.0–8.5)

## 2019-08-15 LAB — CBC/DIFF AMBIGUOUS DEFAULT
Basophils Absolute: 0 10*3/uL (ref 0.0–0.2)
Basos: 0 %
EOS (ABSOLUTE): 0.1 10*3/uL (ref 0.0–0.4)
Eos: 2 %
Hematocrit: 46.4 % (ref 37.5–51.0)
Hemoglobin: 15.2 g/dL (ref 13.0–17.7)
Immature Grans (Abs): 0 10*3/uL (ref 0.0–0.1)
Immature Granulocytes: 0 %
Lymphocytes Absolute: 2.2 10*3/uL (ref 0.7–3.1)
Lymphs: 31 %
MCH: 28.2 pg (ref 26.6–33.0)
MCHC: 32.8 g/dL (ref 31.5–35.7)
MCV: 86 fL (ref 79–97)
Monocytes Absolute: 0.4 10*3/uL (ref 0.1–0.9)
Monocytes: 6 %
Neutrophils Absolute: 4.3 10*3/uL (ref 1.4–7.0)
Neutrophils: 61 %
Platelets: 263 10*3/uL (ref 150–450)
RBC: 5.39 x10E6/uL (ref 4.14–5.80)
RDW: 14.2 % (ref 11.6–15.4)
WBC: 7 10*3/uL (ref 3.4–10.8)

## 2019-08-15 LAB — SPECIMEN STATUS REPORT

## 2019-08-25 NOTE — Telephone Encounter (Signed)
Yes we could start Crestor 5 mg daily That with fish oil with low-carb with treadmill we should be fine

## 2019-09-11 MED ORDER — ROSUVASTATIN CALCIUM 5 MG PO TABS: 5.0000 mg | ORAL_TABLET | Freq: Every day | ORAL | 3 refills | Status: DC

## 2019-12-09 NOTE — Progress Notes (Signed)
Evaluation Performed:  Follow-up visit  Date:  12/10/2019   ID:  Janey Greaser, DOB 28-Mar-1971, MRN [id number]  Patient Location:  [address] [place] [address]   Provider location:   [facility], [place] office  PCP:  Patient, No Pcp Per  Cardiologist:  Fonnie Mu   Chief Complaint  Patient presents with  . Other    Discuss heart rate issues mentioned having a spike one morning. Meds reviewed verbally with pt.    History of Present Illness:    Jeffrey Morales is a 49 y.o. male  past medical history of obesity,  elevated triglycerides,  Prior history of tachycardia,  Previously seen at [facility] with symptoms of dehydration, tachycardia . History of partial colectomy in the past, part of small bowel, part of large intestine previous difficulty maintaining hydration given previous GI surgery  CT coronary calcium score of 0 Echo 09/2015: normal, EF 60% hypertension who presents For routine follow-up of his tachycardia and hypertension  Labs reviewed Total chol 235, LDL 99, TG 556, HDL 43 Started on crestor 5 mg daily 08/2019, did not take it  1 1/2 weeks ago, heart rate spiked. Was in a rush, Was jittery in the shower, had not taken metoprolol yet that AM Took 2 metoprolol tartrate that AM, got better  Happy with metoprolol succinate 150 every AM Controls heart rate Having ectopy, possibly PVCs, few times a week  Covid in dec 2020  Weight high  EKG personally reviewed by myself on todays visit NSR rate 90 no significant ST or T wave changes  Other past medical history reviewed Previousepisodes of tachycardia,  over the summer of 2017, finished dinner, went outside , Had heart rate up to 180, down to 140 with slower pace (has started drinking coffee) Tried to drink fluids, felt nauseous, vomited. Later heart rate improved Second episode several days later, heart rate up to 140 Previously taking metoprolol 12.5 mg twice a  day After episodes of tachycardia, increased the metoprolol back to 25 mgrams twice a day   Previous GI bug in 2016, was on HCTZ at the time . He is having significant tachycardia, palpitations . EKG showed a rare APC, PVCs with sinus tachycardia, rate in the 120 range  Magnesium 1.6, potassium 3.4  He was hydrated with improvement of his symptoms  Echocardiogram showed normal LV function , question of mild LVH, otherwise normal study    Prior CV studies:   The following studies were reviewed today:    Past Medical History:  Diagnosis Date  . Allergy   . Bulging lumbar disc    L1-S1  . Chronic diarrhea   . Diverticulosis   . Essential hypertension   . GERD (gastroesophageal reflux disease)   . Hyperlipidemia   . PVC (premature ventricular contraction)    no problems currently  . Reflux   . Small bowel perforation (HCC)    in 2003, with abscess and inflammatory mass, path c/w Crohns   Past Surgical History:  Procedure Laterality Date  . LAPAROSCOPIC ILEOCECECTOMY    . MANDIBLE FRACTURE SURGERY    . WISDOM TOOTH EXTRACTION        Allergies:   Clarithromycin and Penicillins   Social History   Tobacco Use  . Smoking status: Never Smoker  . Smokeless tobacco: Never Used  Substance Use Topics  . Alcohol use: No    Alcohol/week: 0.0 standard drinks  . Drug use: No  Current Outpatient Medications on File Prior to Visit  Medication Sig Dispense Refill  . aspirin EC 81 MG tablet Take 81 mg by mouth daily.    Marland Kitchen b complex vitamins tablet Take 1 tablet by mouth daily.    . Choline Fenofibrate (FENOFIBRIC ACID) 135 MG CPDR Take 1 capsule by mouth daily. 90 capsule 3  . loratadine (CLARITIN) 10 MG tablet Take 10 mg by mouth daily.    . metoprolol succinate (TOPROL-XL) 100 MG 24 hr tablet Take 1 tablet (100 mg total) by mouth daily. Take with or immediately following a meal. 90 tablet 3  . metoprolol succinate (TOPROL-XL) 50 MG 24 hr tablet Take 1 tablet (50 mg  total) by mouth daily. Take with or immediately following a meal. 90 tablet 3  . metoprolol tartrate (LOPRESSOR) 25 MG tablet Take 25 mg by mouth as needed.    . Multiple Vitamin (MULTIVITAMIN) tablet Take 1 tablet by mouth daily.    . Multiple Vitamins-Minerals (ZINC PO) Take by mouth daily.    . naproxen sodium (ANAPROX) 220 MG tablet Take 220 mg by mouth daily as needed.     . Omega-3 Fatty Acids (FISH OIL PO) Take by mouth daily.    Marland Kitchen POTASSIUM CHLORIDE PO Take by mouth.    Marland Kitchen POTASSIUM PO Take by mouth daily.    . rosuvastatin (CRESTOR) 5 MG tablet Take 1 tablet (5 mg total) by mouth daily. 90 tablet 3  . VITAMIN D PO Take by mouth daily.     No current facility-administered medications on file prior to visit.     Family Hx: The patient's family history includes Hypertension in his father; Lymphoma in his father. There is no history of Colon cancer, Esophageal cancer, Rectal cancer, or Stomach cancer.  ROS:   Please see the history of present illness.    Review of Systems  Constitutional: Negative.   HENT: Negative.   Respiratory: Positive for shortness of breath.   Cardiovascular: Positive for palpitations.  Gastrointestinal: Negative.   Musculoskeletal: Negative.   Neurological: Negative.   Psychiatric/Behavioral: Negative.  Memory loss: sinu tach.  All other systems reviewed and are negative.    Labs/Other Tests and Data Reviewed:    Recent Labs: 08/14/2019: ALT 28; BUN 13; Creatinine, Ser 0.83; Hemoglobin 15.2; Platelets 263; Potassium 4.5; Sodium 138   Recent Lipid Panel Lab Results  Component Value Date/Time   CHOL 235 (H) 08/14/2019 08:29 AM   TRIG 556 (HH) 08/14/2019 08:29 AM   HDL 43 08/14/2019 08:29 AM   CHOLHDL 4.3 11/17/2016 01:04 PM   LDLCALC 99 08/14/2019 08:29 AM    Wt Readings from Last 3 Encounters:  12/10/19 295 lb 8 oz (134 kg)  08/06/19 285 lb (129.3 kg)  05/02/18 277 lb (125.6 kg)     Exam:    BP (!) 142/100 (BP Location: Left Arm, Patient  Position: Sitting, Cuff Size: Large)   Pulse 90   Ht 5\' 11"  (1.803 m)   Wt 295 lb 8 oz (134 kg)   SpO2 98%   BMI 41.21 kg/m  Constitutional:  oriented to person, place, and time. No distress. obese HENT:  Head: Grossly normal Eyes:  no discharge. No scleral icterus.  Neck: No JVD, no carotid bruits  Cardiovascular: Regular rate and rhythm, no murmurs appreciated Pulmonary/Chest: Clear to auscultation bilaterally, no wheezes or rails Abdominal: Soft.  no distension.  no tenderness.  Musculoskeletal: Normal range of motion Neurological:  normal muscle tone. Coordination normal. No atrophy Skin:  Skin warm and dry Psychiatric: normal affect, pleasant  ASSESSMENT & PLAN:    Problem List Items Addressed This Visit      Cardiology Problems   Hypertension   Relevant Medications   metoprolol tartrate (LOPRESSOR) 25 MG tablet   aspirin EC 81 MG tablet     Other   Sinus tachycardia - Primary   Relevant Orders   EKG 12-Lead   Palpitations   Relevant Orders   EKG 12-Lead     Essential hypertension Blood pressure is well controlled on today's visit. No changes made to the medications.  S/p colectomy  continue aggressive hydration stay hydrated, this will affect heart rate  Sinus tachycardia We will increase the metoprolol Stay on metoprolol succinate 150 in the morning Metoprolol tartrate as needed  Class 1 obesity due to excess calories without serious comorbidity with body mass index (BMI) of 30.0 to 30.9 in adult We have encouraged continued exercise, careful diet management in an effort to lose weight.  S/p covid: Some SOB Will check echo, cxr  Disposition: Follow-up in 12 months   Total encounter time more than 25 minutes  Greater than 50% was spent in counseling and coordination of care with the patient  Signed, Julien Nordmann, MD  [facility] [address], [place], [place] [address]

## 2019-12-10 ENCOUNTER — Ambulatory Visit (INDEPENDENT_AMBULATORY_CARE_PROVIDER_SITE_OTHER): Payer: BC Managed Care – PPO | Admitting: Cardiovascular Disease

## 2019-12-10 ENCOUNTER — Ambulatory Visit
Admission: RE | Admit: 2019-12-10 | Discharge: 2019-12-10 | Disposition: A | Discharge status: Home / Self Care | Payer: BC Managed Care – PPO | Source: Ambulatory Visit | Attending: Cardiovascular Disease | Admitting: Cardiovascular Disease

## 2019-12-10 ENCOUNTER — Other Ambulatory Visit: Payer: Self-pay

## 2019-12-10 ENCOUNTER — Encounter: Payer: Self-pay | Admitting: Cardiovascular Disease

## 2019-12-10 VITALS — BP 142/100 | HR 90 | Ht 71.0 in | Wt 295.5 lb

## 2019-12-10 DIAGNOSIS — R0602 Shortness of breath: Secondary | ICD-10-CM | POA: Diagnosis not present

## 2019-12-10 DIAGNOSIS — R002 Palpitations: Secondary | ICD-10-CM

## 2019-12-10 DIAGNOSIS — R000 Tachycardia, unspecified: Secondary | ICD-10-CM

## 2019-12-10 DIAGNOSIS — I10 Essential (primary) hypertension: Secondary | ICD-10-CM | POA: Diagnosis not present

## 2019-12-10 NOTE — Patient Instructions (Addendum)
Medication Instructions:  No changes  If you need a refill on your cardiac medications before your next appointment, please call your pharmacy.    Lab work: TSH done today   If you have labs (blood work) drawn today and your tests are completely normal, you will receive your results only by: Marland Kitchen MyChart Message (if you have MyChart) OR . A paper copy in the mail If you have any lab test that is abnormal or we need to change your treatment, we will call you to review the results.   Testing/Procedures: A chest x-ray takes a picture of the organs and structures inside the chest, including the heart, lungs, and blood vessels. This test can show several things, including, whether the heart is enlarges; whether fluid is building up in the lungs; and whether pacemaker / defibrillator leads are still in place.  Your physician has requested that you have an echocardiogram. Echocardiography is a painless test that uses sound waves to create images of your heart. It provides your doctor with information about the size and shape of your heart and how well your heart's chambers and valves are working. This procedure takes approximately one hour. There are no restrictions for this procedure.     Follow-Up: At [facility], you and your health needs are our priority.  As part of our continuing mission to provide you with exceptional heart care, we have created designated Provider Care Teams.  These Care Teams include your primary Cardiologist (physician) and Advanced Practice Providers (APPs -  Physician Assistants and Nurse Practitioners) who all work together to provide you with the care you need, when you need it.  . You will need a follow up appointment in 12 months   . Providers on your designated Care Team:   . Nicolasa Ducking, NP . Eula Listen, PA-C . Marisue Ivan, PA-C  Any Other Special Instructions Will Be Listed Below (If Applicable).  For educational health videos Log in to :  [email or web address] Or : [email or web address], password : [email or web address]

## 2019-12-11 LAB — TSH: TSH: 0.932 u[IU]/mL (ref 0.450–4.500)

## 2019-12-30 ENCOUNTER — Other Ambulatory Visit: Payer: Self-pay

## 2019-12-30 ENCOUNTER — Ambulatory Visit (INDEPENDENT_AMBULATORY_CARE_PROVIDER_SITE_OTHER): Payer: BC Managed Care – PPO

## 2019-12-30 DIAGNOSIS — R0602 Shortness of breath: Secondary | ICD-10-CM

## 2019-12-30 MED ORDER — PERFLUTREN LIPID MICROSPHERE
1.0000 mL | INTRAVENOUS | Status: AC | PRN
  Administered 2019-12-30: 2 mL via INTRAVENOUS

## 2020-01-05 ENCOUNTER — Telehealth: Payer: Self-pay | Admitting: *Deleted

## 2020-01-05 NOTE — Telephone Encounter (Signed)
Reviewed results and recommendations with patient and he did want to know with the left ventricular hypertrophy if he should increase any medications for blood pressure or add an ace or arb. Let him know that I would forward message to provider for further review and recommendations. He verbalized understanding with no further questions.

## 2020-01-05 NOTE — Telephone Encounter (Signed)
-----   Message from Antonieta Iba, MD sent at 01/04/2020  7:53 PM EST ----- Echo looks good No major issues

## 2020-01-07 NOTE — Telephone Encounter (Signed)
Can he send [place] some BP numbers Could send through mychart if easier

## 2020-01-07 NOTE — Telephone Encounter (Signed)
My Chart message sent to patient requesting blood pressure readings 

## 2020-02-04 ENCOUNTER — Telehealth: Payer: Self-pay | Admitting: Cardiovascular Disease

## 2020-02-04 NOTE — Telephone Encounter (Signed)
[facility] and spoke with the pharmacist [place].  Advised her Dr. Windell Hummingbird instructions regarding the patients Metoprolol at the patients 12/09/19 o/v. Sinus tachycardia We will increase the metoprolol Stay on metoprolol succinate 150 in the morning Metoprolol tartrate as needed  Lynden Ang sts that she also contacted the patient and verified that he is taking 150 mg qd.  No further action required.

## 2020-02-04 NOTE — Telephone Encounter (Signed)
Pt c/o medication issue:  1. Name of Medication: metoprolol succinate (TOPROL-XL) 100 MG 24 hr tablet  2. How are you currently taking this medication (dosage and times per day)? As directed  3. Are you having a reaction (difficulty breathing--STAT)? no  4. What is your medication issue? C[facility] is calling to get the correct dosage for this medication. There is 100mg  and 50mg . 50mg  looks like is expired but she states she has prescriptions for both medications. Please advise.

## 2020-07-30 ENCOUNTER — Other Ambulatory Visit: Payer: Self-pay | Admitting: Cardiovascular Disease

## 2020-07-31 ENCOUNTER — Other Ambulatory Visit: Payer: Self-pay | Admitting: Cardiovascular Disease

## 2020-08-15 ENCOUNTER — Other Ambulatory Visit: Payer: Self-pay | Admitting: Cardiovascular Disease

## 2020-10-12 ENCOUNTER — Other Ambulatory Visit: Payer: Self-pay | Admitting: Cardiovascular Disease

## 2020-10-27 ENCOUNTER — Other Ambulatory Visit (INDEPENDENT_AMBULATORY_CARE_PROVIDER_SITE_OTHER): Payer: BC Managed Care – PPO

## 2020-10-27 ENCOUNTER — Encounter: Payer: Self-pay | Admitting: Gastroenterology

## 2020-10-27 ENCOUNTER — Ambulatory Visit (INDEPENDENT_AMBULATORY_CARE_PROVIDER_SITE_OTHER): Payer: BC Managed Care – PPO | Admitting: Gastroenterology

## 2020-10-27 VITALS — BP 152/100 | HR 90 | Ht 71.0 in | Wt 290.0 lb

## 2020-10-27 DIAGNOSIS — R194 Change in bowel habit: Secondary | ICD-10-CM

## 2020-10-27 DIAGNOSIS — R140 Abdominal distension (gaseous): Secondary | ICD-10-CM

## 2020-10-27 DIAGNOSIS — K219 Gastro-esophageal reflux disease without esophagitis: Secondary | ICD-10-CM

## 2020-10-27 DIAGNOSIS — Z8719 Personal history of other diseases of the digestive system: Secondary | ICD-10-CM

## 2020-10-27 LAB — CBC WITH DIFFERENTIAL/PLATELET
Basophils Absolute: 0 10*3/uL (ref 0.0–0.1)
Basophils Relative: 0.4 % (ref 0.0–3.0)
Eosinophils Absolute: 0.1 10*3/uL (ref 0.0–0.7)
Eosinophils Relative: 1.3 % (ref 0.0–5.0)
HCT: 44.6 % (ref 39.0–52.0)
Hemoglobin: 15.8 g/dL (ref 13.0–17.0)
Lymphocytes Relative: 28.7 % (ref 12.0–46.0)
Lymphs Abs: 2 10*3/uL (ref 0.7–4.0)
MCHC: 35.3 g/dL (ref 30.0–36.0)
MCV: 83.9 fl (ref 78.0–100.0)
Monocytes Absolute: 0.4 10*3/uL (ref 0.1–1.0)
Monocytes Relative: 5.9 % (ref 3.0–12.0)
Neutro Abs: 4.5 10*3/uL (ref 1.4–7.7)
Neutrophils Relative %: 63.7 % (ref 43.0–77.0)
Platelets: 272 10*3/uL (ref 150.0–400.0)
RBC: 5.32 Mil/uL (ref 4.22–5.81)
RDW: 13.3 % (ref 11.5–15.5)
WBC: 7 10*3/uL (ref 4.0–10.5)

## 2020-10-27 LAB — COMPREHENSIVE METABOLIC PANEL
ALT: 30 U/L (ref 0–53)
AST: 28 U/L (ref 0–37)
Albumin: 4.4 g/dL (ref 3.5–5.2)
Alkaline Phosphatase: 66 U/L (ref 39–117)
BUN: 20 mg/dL (ref 6–23)
CO2: 22 mEq/L (ref 19–32)
Calcium: 9.4 mg/dL (ref 8.4–10.5)
Chloride: 100 mEq/L (ref 96–112)
Creatinine, Ser: 0.88 mg/dL (ref 0.40–1.50)
GFR: 101.21 mL/min (ref >60.00)
Glucose, Bld: 87 mg/dL (ref 70–99)
Potassium: 4.3 mEq/L (ref 3.5–5.1)
Sodium: 133 mEq/L — ABNORMAL LOW (ref 135–145)
Total Bilirubin: 0.4 mg/dL (ref 0.2–1.2)
Total Protein: 7.2 g/dL (ref 6.0–8.3)

## 2020-10-27 MED ORDER — PSYLLIUM 58.6 % PO PACK: PACK | ORAL | 12 refills | Status: DC

## 2020-10-27 MED ORDER — CITRUCEL PO POWD: ORAL | Status: AC

## 2020-10-27 MED ORDER — SUTAB 1479-225-188 MG PO TABS: 1.0000 | ORAL_TABLET | Freq: Once | ORAL | 0 refills | Status: AC

## 2020-10-27 MED ORDER — FAMOTIDINE 20 MG PO TABS: ORAL_TABLET | ORAL | 3 refills | Status: DC

## 2020-10-27 NOTE — Patient Instructions (Addendum)
If you are age 49 or older, your body mass index should be between 23-30. Your Body mass index is 40.45 kg/m. If this is out of the aforementioned range listed, please consider follow up with your Primary Care Provider.  If you are age 51 or younger, your body mass index should be between 19-25. Your Body mass index is 40.45 kg/m. If this is out of the aformentioned range listed, please consider follow up with your Primary Care Provider.   You have been scheduled for a colonoscopy. Please follow written instructions given to you at your visit today.  Please pick up your prep supplies at the pharmacy within the next 1-3 days. If you use inhalers (even only as needed), please bring them with you on the day of your procedure.   Please hold Aleve and all NSAIDs.  Use Tylenol as needed.  Discontinue Probiotic and Metamucil.  Please purchase the following medications over the counter and take as directed: Citrucel: Take as directed, daily.  We have sent the following medications to your pharmacy for you to pick up at your convenience: Pepcid 20 mg: Take once or twice daily as needed  Please go to the lab in the basement of our building to have lab work done as you leave today. Hit "B" for basement when you get on the elevator.  When the doors open the lab is on your left.  We will call you with the results. Thank you.  Due to recent changes in healthcare laws, you may see the results of your imaging and laboratory studies on MyChart before your provider has had a chance to review them.  We understand that in some cases there may be results that are confusing or concerning to you. Not all laboratory results come back in the same time frame and the provider may be waiting for multiple results in order to interpret others.  Please give [place] 48 hours in order for your provider to thoroughly review all the results before contacting the office for clarification of your results.   Thank you for entrusting  me with your care and for choosing [facility], Dr. Ileene Patrick

## 2020-10-27 NOTE — Progress Notes (Signed)
HPI :  49 year old male with a reported history of possible Crohn's disease remotely, chronic diarrhea, GERD, here to reestablish care for these issues.  I have not seen him since June 2017.  Recall that he had a spontaneous colon perforation in 2003 when he presented with worsening abdominal pain.  Prior to that he had an EGD showing a gastric ulcer as well as a colonoscopy of which the results are not known.  He presented in May of 2003 to [facility] with severe abdominal pain. CT scan showing inflammatory changes in the RLQ, concerning for Crohns vs. Appendicitis. 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 Ileocecal valve resection was performed. Pathology was most consistent with Crohns disease with purulent serositis and abscess. He was placed on Pentasa for about a year after his surgery and then stopped as it made no difference in his symptoms.  Since then he has had some chronic loose stools which bother him.  He has 1-2 loose BMs per day in the morning, and then another 1 or 2 later in the day.  He takes fiber supplement every night he states this does provide some benefit.  He specifically takes Metamucil.  He does have some gas and bloating that bothers him.  He does see rare blood in the stools as well.  He has had some occasional right-sided abdominal pain that is fleeting, he thinks potentially some postprandial relationship.  He does take a probiotic every day, he is not sure if that helps.  He does take Aleve once daily for low back pain which has been a chronic issue for him.  He has had some ongoing reflux that bothers him.  He has a little heartburn in the morning, this sometimes also wakes him up at night.  He has been taking multiple Tums every night before bed to manage this.  He has not really taken much of anything other than Tums recently.  He previously was on  Protonix which helped his reflux however he developed PVCs and PACs from when she was very symptomatic.  He stopped Protonix and hydrochlorothiazide and states he had significant improvement.  He previously was on Zantac, Prevacid, Pepcid remotely several years ago, he cannot remember how well does work for them.  He denies any dysphagia but the reflux he experiences nightly really bothers him.  He has never had a prior EGD.  When I saw him in 2017 we performed a colonoscopy which showed that the anastomosis was patent but due to a sharp angulation at the anastomosis it could not be easily traversed, and had multiple ulcerations at the site.  Otherwise no other significant inflammation.  Biopsies were taken which did not show any definitive evidence of Crohn's disease.  He had a follow-up fecal calprotectin which was normal in 2017.  Of note his blood pressure is a bit elevated in the clinic today.  He is wondering if he needs a new regimen for that given he had to stop his hydrochlorothiazide due to the PVCs and electrolyte imbalances.  He takes metoprolol on a daily basis.  Colonoscopy 03/14/2016 - The perianal and digital rectal examinations were normal. - There was evidence of a prior end-to-side ileo-colonic anastomosis in the right colon. This was patent and was characterized by multiple areas of ulcerated mucosa around the anastomosis. The anastomosis was not traversed due to a very angulated  entrance into the anastomosis, although the ileal mucosa proximal to anastomosis appeared normal. The anastomosis was biopsied with a cold forceps for histology. - A patchy area of mildly erythematous mucosa was found in the sigmoid colon, without ulceration or erosion. - A few small-mouthed diverticula were found in the sigmoid colon. - The exam was otherwise without abnormality. No other inflammatory changes were appreciated - Biopsies for histology were taken with a cold forceps from the right colon and  left colon for evaluation of microscopic colitis.   1. Surgical [P], surgical anastomosis - ULCER WITH REACTIVE CHANGES. - NO GRANULOMAS. - NO DYSPLASIA OR MALIGNANCY. 2. Surgical [P], random sites-right & left random colon - BENIGN COLONIC MUCOSA. - NO ACTIVE INFLAMMATION OR EVIDENCE OF MICROSCOPIC COLITIS. - NO DYSPLASIA OR MALIGNANCY.   Echo 12/30/19 - EF 60-65%  Fecal calprotectin 05/12/2016 - 2   Past Medical History:  Diagnosis Date  . Allergy   . Bulging lumbar disc    L1-S1  . Chronic diarrhea   . Diverticulosis   . Essential hypertension   . GERD (gastroesophageal reflux disease)   . Hyperlipidemia   . PVC (premature ventricular contraction)    no problems currently  . Reflux   . Small bowel perforation ([place])    in 2003, with abscess and inflammatory mass, path c/w Crohns     Past Surgical History:  Procedure Laterality Date  . LAPAROSCOPIC ILEOCECECTOMY    . MANDIBLE FRACTURE SURGERY    . WISDOM TOOTH EXTRACTION     Family History  Problem Relation Age of Onset  . Hypertension Father   . Lymphoma Father   . Colon cancer Neg Hx   . Esophageal cancer Neg Hx   . Rectal cancer Neg Hx   . Stomach cancer Neg Hx    Social History   Tobacco Use  . Smoking status: Never Smoker  . Smokeless tobacco: Never Used  Substance Use Topics  . Alcohol use: No    Alcohol/week: 0.0 standard drinks  . Drug use: No   Current Outpatient Medications  Medication Sig Dispense Refill  . aspirin EC 81 MG tablet Take 81 mg by mouth daily.    Marland Kitchen b complex vitamins tablet Take 1 tablet by mouth daily.    Marland Kitchen loratadine (CLARITIN) 10 MG tablet Take 10 mg by mouth daily.    . Magnesium 250 MG TABS Take 1 tablet by mouth daily.    . metoprolol succinate (TOPROL-XL) 100 MG 24 hr tablet TAKE 1 TABLET BY MOUTH DAILY. TAKE WITH OR IMMEDIATELY FOLLOWING A MEAL. 90 tablet 0  . metoprolol succinate (TOPROL-XL) 50 MG 24 hr tablet TAKE 1 TABLET BY MOUTH DAILY. TAKE WITH OR IMMEDIATELY  FOLLOWING A MEAL. 90 tablet 3  . metoprolol tartrate (LOPRESSOR) 25 MG tablet Take 25 mg by mouth as needed.    . Multiple Vitamin (MULTIVITAMIN) tablet Take 1 tablet by mouth daily.    . Multiple Vitamins-Minerals (ZINC PO) Take by mouth daily.    Marland Kitchen POTASSIUM CHLORIDE PO Take by mouth.    Marland Kitchen VITAMIN D PO Take by mouth daily.    . famotidine (PEPCID) 20 MG tablet Take one tablet by mouth once to twice a day as needed 90 tablet 3  . methylcellulose (CITRUCEL) oral powder Take daily as directed    . Omega-3 Fatty Acids (FISH OIL PO) Take by mouth daily.    . Sodium Sulfate-Mag Sulfate-KCl (SUTAB) [phone] MG TABS Take 1 kit by mouth once for 1 dose. Marland Kitchen  MANUFACTURER CODES!! BIN: [id number] PCN: CN GROUP: [id number] MEMBER ID: [id number];FWY AS SECONDARY INSURANCE ;NO PRIOR AUTHORIZATION 24 tablet 0   No current facility-administered medications for this visit.   Allergies  Allergen Reactions  . Clarithromycin Other (See Comments)    "out-of-body" experience  . Penicillins     Has patient had a PCN reaction causing immediate rash, facial/tongue/throat swelling, SOB or lightheadedness with hypotension: Yes Has patient had a PCN reaction causing severe rash involving mucus membranes or skin necrosis: No Has patient had a PCN reaction that required hospitalization No Has patient had a PCN reaction occurring within the last 10 years: No n If all of the above answers are "NO", then may proceed with Cephalosporin use.      Review of Systems: All systems reviewed and negative except where noted in HPI.   Lab Results  Component Value Date   WBC 7.0 08/14/2019   HGB 15.2 08/14/2019   HCT 46.4 08/14/2019   MCV 86 08/14/2019   PLT 263 08/14/2019    Lab Results  Component Value Date   CREATININE 0.83 08/14/2019   BUN 13 08/14/2019   NA 138 08/14/2019   K 4.5 08/14/2019   CL 101 08/14/2019   CO2 22 08/14/2019    Lab Results  Component Value Date   ALT 28 08/14/2019   AST 19  08/14/2019   ALKPHOS 65 08/14/2019   BILITOT 0.4 08/14/2019     Physical Exam: BP (!) 152/100   Pulse 90   Ht 5' 11"  (1.803 m)   Wt 290 lb (131.5 kg)   BMI 40.45 kg/m  Constitutional: Pleasant,well-developed, male in no acute distress. HEENT: Normocephalic and atraumatic. Conjunctivae are normal. No scleral icterus. Neck supple.  Cardiovascular: Normal rate, regular rhythm.  Pulmonary/chest: Effort normal and breath sounds normal.  Abdominal: Soft, nondistended, nontender.  There are no masses palpable.  Extremities: no edema Lymphadenopathy: No cervical adenopathy noted. Neurological: Alert and oriented to person place and time. Skin: Skin is warm and dry. No rashes noted. Psychiatric: Normal mood and affect. Behavior is normal.   ASSESSMENT AND PLAN: 49 year old male here to reestablish care regarding the following:  History of suspected Crohn's disease / Altered bowel habits / Bloating - initial surgical pathology was most concerning for Crohn's disease however he did not have any significant disease noted endoscopically in 2017.  Some ulcerations noted at the anastomosis, in the setting of NSAID use which could be related, or possible postop/ischemic change.  Biopsies were not typical for Crohn's.  Further fecal calprotectin was negative at that time.  I have not seen him since then and he continues to have some loose stools with bloating and occasional cramping.  We discussed differential diagnosis which includes Crohn's disease, anastomotic stricture, SIBO, IBS.  Discussed some options with him for further evaluation at this time.  I think a colonoscopy is reasonable to reevaluate the area, assess for stricturing at the anastomosis, assess for active Crohn's disease.  His NSAID use is confounding this presentation and I recommend he completely stop NSAID use for at least 4 weeks before we do a colonoscopy and he was agreeable to do that.  After discussion of risks and benefits of  colonoscopy he wants to proceed.  In the interim I recommend that he stop a daily probiotic, it is not helping him that much.  Given his bloating I think it may be reasonable to stop the Metamucil and switch to Citrucel which may be better tolerated.  He  is at risk for SIBO given his ileocecectomy and we can consider an empiric trial of rifaximin if his colonoscopy does not show anything concerning in no active Crohn's disease.  I will otherwise check a CBC and c-Met today to make sure stable as he has not had that done in the past year or so.  He agreed with the plan as outlined, will will make further recommendations pending the results of his exam and his course.  GERD - as above, poorly controlled reflux leading to nightly Tums use.  Unclear if the Protonix was truly related to his PVCs as he was on other medications which may be more likely to do this however he has felt better after stopping it in regards to those symptoms but reflux currently bother him significantly.  I discussed his options.  We will try him back on Pepcid 20 mg once to twice daily initially to see if he tolerates that better given this has minimal side effects.  We will see how he does initially on this.  I do think he warrants an EGD for Barrett's screening but will give him some time on the Pepcid first and plan on doing this next year at some point time once his reflux is better controlled on medical therapy to be able to more accurately screen for Barrett's.  He agreed with this  [place] Cellar, MD [facility]

## 2020-11-16 ENCOUNTER — Other Ambulatory Visit: Payer: Self-pay

## 2020-11-16 DIAGNOSIS — K6389 Other specified diseases of intestine: Secondary | ICD-10-CM

## 2020-11-16 MED ORDER — RIFAXIMIN 550 MG PO TABS: 550.0000 mg | ORAL_TABLET | Freq: Three times a day (TID) | ORAL | 0 refills | Status: DC

## 2020-11-17 ENCOUNTER — Other Ambulatory Visit: Payer: Self-pay

## 2020-11-17 DIAGNOSIS — K6389 Other specified diseases of intestine: Secondary | ICD-10-CM

## 2020-11-17 MED ORDER — RIFAXIMIN 550 MG PO TABS: 550.0000 mg | ORAL_TABLET | Freq: Three times a day (TID) | ORAL | 0 refills | Status: AC

## 2020-11-17 NOTE — Progress Notes (Unsigned)
Rec'd notification that xifaxan requires a PA.  Resent script for Xifaxan to [facility].

## 2020-11-22 ENCOUNTER — Telehealth: Payer: Self-pay

## 2020-11-22 NOTE — Telephone Encounter (Signed)
Called and checked on status of Xifaxan.  [facility] indicated that they have submitted PA and are waiting on response but asked that we send the last office visit.  Faxed office note from 10-27-20 to fax #[phone]

## 2020-11-24 NOTE — Telephone Encounter (Signed)
Called to check status. They received the Office notes and are still waiting on PA determination

## 2020-11-30 NOTE — Telephone Encounter (Signed)
Called [facility] at [phone] to check on status of PA. Placed on a 10 minute hold.

## 2020-12-01 NOTE — Telephone Encounter (Signed)
Called [facility]. They do not have an answer back on the PA.  Will check with insurance and call me back with an update.

## 2020-12-01 NOTE — Telephone Encounter (Signed)
Rec'd approval from [facility] and [employer] that PA for Jeffrey Morales has been approved through 12-01-2021. Reference #[id number] They have contacted patient and are making arrangements to mail medication to him.  Spoke to patient. He indicated he had already heard from they.  Without coverage it was going to be $2000 but he will now pay $0.

## 2020-12-09 ENCOUNTER — Encounter: Payer: BC Managed Care – PPO | Admitting: Gastroenterology

## 2020-12-10 ENCOUNTER — Ambulatory Visit: Payer: BC Managed Care – PPO | Admitting: Cardiovascular Disease

## 2020-12-18 NOTE — Progress Notes (Unsigned)
Evaluation Performed:  Follow-up visit  Date:  12/20/2020   ID:  Jeffrey Morales, DOB 09-Apr-1971, MRN [id number]  Patient Location:  [address] [place] [address]   Provider location:   [facility], [place] office  PCP:  Patient, No Pcp Per  Cardiologist:  Fonnie Mu   Chief Complaint  Patient presents with  . Follow-up    Annual follow up and would like to discuss adjusting some of his medications. Medications verbally reviewed with patient.    History of Present Illness:    Jeffrey Morales is a 50 y.o. male  past medical history of obesity,  elevated triglycerides,  history of tachycardia,  Previously seen at [facility] with symptoms of dehydration, tachycardia . History of partial colectomy in the past, part of small bowel, part of large intestine previous difficulty maintaining hydration given previous GI surgery  CT coronary calcium score of 0 Echo 09/2015: normal, EF 60% hypertension who presents For routine follow-up of his tachycardia and hypertension  Recent changes to his diet, keto diet Noticed some side effects including GI, Heart rate increased Changes, sodium 133 More diarrhea Increased metoprolol succinate 150 in AM and 50 in PM Seen by GI: started on ABX  BP elevated today, long drive this morning, long walk into the office Has not been monitoring at home  Stress at work, weight up  Labs reviewed Lab Results  Component Value Date   CHOL 235 (H) 08/14/2019   HDL 43 08/14/2019   LDLCALC 99 08/14/2019   TRIG 556 (HH) 08/14/2019    EKG personally reviewed by myself on todays visit NSR rate 86 no significant ST or T wave changes  Other past medical history reviewed Covid in dec 2020  Previousepisodes of tachycardia,  over the summer of 2017, finished dinner, went outside , Had heart rate up to 180, down to 140 with slower pace (has started drinking coffee) Tried to drink fluids, felt nauseous, vomited. Later  heart rate improved Second episode several days later, heart rate up to 140 Previously taking metoprolol 12.5 mg twice a day After episodes of tachycardia, increased the metoprolol back to 25 mgrams twice a day   Previous GI bug in 2016, was on HCTZ at the time . He is having significant tachycardia, palpitations . EKG showed a rare APC, PVCs with sinus tachycardia, rate in the 120 range  Magnesium 1.6, potassium 3.4  He was hydrated with improvement of his symptoms  Echocardiogram showed normal LV function , question of mild LVH, otherwise normal study   Prior CV studies:   The following studies were reviewed today:    Past Medical History:  Diagnosis Date  . Allergy   . Bulging lumbar disc    L1-S1  . Chronic diarrhea   . Diverticulosis   . Essential hypertension   . GERD (gastroesophageal reflux disease)   . Hyperlipidemia   . PVC (premature ventricular contraction)    no problems currently  . Reflux   . Small bowel perforation ([facility])    in 2003, with abscess and inflammatory mass, path c/w Crohns   Past Surgical History:  Procedure Laterality Date  . LAPAROSCOPIC ILEOCECECTOMY    . MANDIBLE FRACTURE SURGERY    . WISDOM TOOTH EXTRACTION        Allergies:   Clarithromycin and Penicillins   Social History   Tobacco Use  . Smoking status: Never Smoker  . Smokeless tobacco: Never Used  Substance Use Topics  .  Alcohol use: No    Alcohol/week: 0.0 standard drinks  . Drug use: No     Current Outpatient Medications on File Prior to Visit  Medication Sig Dispense Refill  . aspirin EC 81 MG tablet Take 81 mg by mouth daily.    Marland Kitchen b complex vitamins tablet Take 1 tablet by mouth daily.    . Choline Fenofibrate (FENOFIBRIC ACID) 135 MG CPDR     . famotidine (PEPCID) 20 MG tablet Take one tablet by mouth once to twice a day as needed 90 tablet 3  . loratadine (CLARITIN) 10 MG tablet Take 10 mg by mouth daily.    . Magnesium 250 MG TABS Take 1 tablet by mouth  daily.    . methylcellulose (CITRUCEL) oral powder Take daily as directed    . metoprolol succinate (TOPROL-XL) 100 MG 24 hr tablet TAKE 1 TABLET BY MOUTH DAILY. TAKE WITH OR IMMEDIATELY FOLLOWING A MEAL. 90 tablet 0  . metoprolol succinate (TOPROL-XL) 50 MG 24 hr tablet TAKE 1 TABLET BY MOUTH DAILY. TAKE WITH OR IMMEDIATELY FOLLOWING A MEAL. 90 tablet 3  . Multiple Vitamins-Minerals (ZINC PO) Take by mouth daily.    Marland Kitchen POTASSIUM CHLORIDE PO Take by mouth.    Marland Kitchen VITAMIN D PO Take by mouth daily.    Marland Kitchen XIFAXAN 550 MG TABS tablet Take 550 mg by mouth 3 (three) times daily.     No current facility-administered medications on file prior to visit.     Family Hx: The patient's family history includes Hypertension in his father; Lymphoma in his father. There is no history of Colon cancer, Esophageal cancer, Rectal cancer, or Stomach cancer.  ROS:   Please see the history of present illness.    Review of Systems  Constitutional: Negative.   HENT: Negative.   Respiratory: Negative.   Cardiovascular: Negative.   Gastrointestinal: Negative.   Musculoskeletal: Negative.   Neurological: Negative.   Psychiatric/Behavioral: Negative.   All other systems reviewed and are negative.    Labs/Other Tests and Data Reviewed:    Recent Labs: 10/27/2020: ALT 30; BUN 20; Creatinine, Ser 0.88; Hemoglobin 15.8; Platelets 272.0; Potassium 4.3; Sodium 133   Recent Lipid Panel Lab Results  Component Value Date/Time   CHOL 235 (H) 08/14/2019 08:29 AM   TRIG 556 (HH) 08/14/2019 08:29 AM   HDL 43 08/14/2019 08:29 AM   CHOLHDL 4.3 11/17/2016 01:04 PM   LDLCALC 99 08/14/2019 08:29 AM    Wt Readings from Last 3 Encounters:  12/20/20 300 lb (136.1 kg)  10/27/20 290 lb (131.5 kg)  12/10/19 295 lb 8 oz (134 kg)     Exam:    BP (!) 138/98 (BP Location: Left Arm, Patient Position: Sitting, Cuff Size: Normal)   Pulse 86   Ht 5\' 11"  (1.803 m)   Wt 300 lb (136.1 kg)   SpO2 98%   BMI 41.84 kg/m   Constitutional:  oriented to person, place, and time. No distress.  HENT:  Head: Grossly normal Eyes:  no discharge. No scleral icterus.  Neck: No JVD, no carotid bruits  Cardiovascular: Regular rate and rhythm, no murmurs appreciated Pulmonary/Chest: Clear to auscultation bilaterally, no wheezes or rails Abdominal: Soft.  no distension.  no tenderness.  Musculoskeletal: Normal range of motion Neurological:  normal muscle tone. Coordination normal. No atrophy Skin: Skin warm and dry Psychiatric: normal affect, pleasant   ASSESSMENT & PLAN:    Problem List Items Addressed This Visit    Sinus tachycardia - Primary  Relevant Orders   EKG 12-Lead   Palpitations   Relevant Orders   EKG 12-Lead    Other Visit Diagnoses    Essential hypertension       Relevant Medications   Choline Fenofibrate (FENOFIBRIC ACID) 135 MG CPDR   metoprolol tartrate (LOPRESSOR) 25 MG tablet   Other Relevant Orders   EKG 12-Lead   Shortness of breath       Mixed hyperlipidemia       Relevant Medications   Choline Fenofibrate (FENOFIBRIC ACID) 135 MG CPDR   metoprolol tartrate (LOPRESSOR) 25 MG tablet     Essential hypertension Elevated on today's visit, he will monitor at home for any medication changes Last a modification recommended  S/p colectomy Recommend he stay hydrated, he is followed by GI, recent course of antibiotics for overgrowth, diarrhea likely affecting electrolytes  Sinus tachycardia  on metoprolol succinate 150 in the morning Added to 50 mg in the p.m.  Class 1 obesity due to excess calories without serious comorbidity with body mass index (BMI) of 30.0 to 30.9 in adult Exercise and diet restriction recommended  Hyperlipidemia He would like to restart Crestor 5 as preventive Chronic calcium score of 0  S/p covid: In 2020 Recovered    Total encounter time more than 25 minutes  Greater than 50% was spent in counseling and coordination of care with the  patient  Signed, Julien Nordmann, MD  [facility] [address], [place], [place] [address]

## 2020-12-20 ENCOUNTER — Encounter: Payer: Self-pay | Admitting: Cardiovascular Disease

## 2020-12-20 ENCOUNTER — Other Ambulatory Visit: Payer: Self-pay

## 2020-12-20 ENCOUNTER — Ambulatory Visit: Payer: BC Managed Care – PPO | Admitting: Cardiovascular Disease

## 2020-12-20 VITALS — BP 138/98 | HR 86 | Ht 71.0 in | Wt 300.0 lb

## 2020-12-20 DIAGNOSIS — R002 Palpitations: Secondary | ICD-10-CM

## 2020-12-20 DIAGNOSIS — R0602 Shortness of breath: Secondary | ICD-10-CM | POA: Diagnosis not present

## 2020-12-20 DIAGNOSIS — R000 Tachycardia, unspecified: Secondary | ICD-10-CM | POA: Diagnosis not present

## 2020-12-20 DIAGNOSIS — I10 Essential (primary) hypertension: Secondary | ICD-10-CM | POA: Diagnosis not present

## 2020-12-20 DIAGNOSIS — E782 Mixed hyperlipidemia: Secondary | ICD-10-CM

## 2020-12-20 MED ORDER — ROSUVASTATIN CALCIUM 5 MG PO TABS: 5.0000 mg | ORAL_TABLET | Freq: Every day | ORAL | 3 refills | Status: DC

## 2020-12-20 MED ORDER — METOPROLOL TARTRATE 25 MG PO TABS: 25.0000 mg | ORAL_TABLET | Freq: Two times a day (BID) | ORAL | 3 refills | Status: DC | PRN

## 2020-12-20 NOTE — Patient Instructions (Addendum)
Medication Instructions:    Metoprolol succinate 150 mg in the morning and metoprolol 50 mg in the evening   Crestor 5 mg once a day  Lab work: Lipids, BMP, TSH  Results will upload to your MyChart account, any abnormity you will get a phone call.    Testing/Procedures: No new testing needed   Follow-Up:  . You will need a follow up appointment in 12 months  . Providers on your designated Care Team:   . Nicolasa Ducking, NP . Eula Listen, PA-C . Marisue Ivan, PA-C

## 2020-12-21 LAB — BASIC METABOLIC PANEL
BUN/Creatinine Ratio: 17 (ref 9–20)
BUN: 17 mg/dL (ref 6–24)
CO2: 21 mmol/L (ref 20–29)
Calcium: 9.9 mg/dL (ref 8.7–10.2)
Chloride: 101 mmol/L (ref 96–106)
Creatinine, Ser: 1.01 mg/dL (ref 0.76–1.27)
GFR calc Af Amer: 100 mL/min/{1.73_m2} (ref >59)
GFR calc non Af Amer: 87 mL/min/{1.73_m2} (ref >59)
Glucose: 94 mg/dL (ref 65–99)
Potassium: 4.6 mmol/L (ref 3.5–5.2)
Sodium: 138 mmol/L (ref 134–144)

## 2020-12-21 LAB — LIPID PANEL
Chol/HDL Ratio: 4.1 ratio (ref 0.0–5.0)
Cholesterol, Total: 211 mg/dL — ABNORMAL HIGH (ref 100–199)
HDL: 52 mg/dL (ref >39)
LDL Chol Calc (NIH): 110 mg/dL — ABNORMAL HIGH (ref 0–99)
Triglycerides: 283 mg/dL — ABNORMAL HIGH (ref 0–149)
VLDL Cholesterol Cal: 49 mg/dL — ABNORMAL HIGH (ref 5–40)

## 2020-12-21 LAB — TSH: TSH: 1.16 u[IU]/mL (ref 0.450–4.500)

## 2021-01-02 ENCOUNTER — Other Ambulatory Visit: Payer: Self-pay | Admitting: Cardiovascular Disease

## 2021-01-03 ENCOUNTER — Other Ambulatory Visit: Payer: Self-pay | Admitting: Cardiovascular Disease

## 2021-01-03 MED ORDER — METOPROLOL SUCCINATE ER 50 MG PO TB24: 50.0000 mg | ORAL_TABLET | Freq: Every evening | ORAL | 2 refills | Status: DC

## 2021-01-03 MED ORDER — METOPROLOL SUCCINATE ER 100 MG PO TB24: 150.0000 mg | ORAL_TABLET | Freq: Every morning | ORAL | 2 refills | Status: DC

## 2021-01-05 ENCOUNTER — Telehealth: Payer: Self-pay | Admitting: *Deleted

## 2021-01-05 ENCOUNTER — Other Ambulatory Visit: Payer: Self-pay | Admitting: Cardiovascular Disease

## 2021-01-05 NOTE — Telephone Encounter (Signed)
PA required for Metoprolol Succinate 100 mg tablet. Take 1.5 mg tablet by mouth every morning. PA has been submitted through covermymeds. Awaiting approval.  [facility] has not yet replied to your PA request. You may close this dialog, return to your dashboard, and perform other tasks.  To check for an update later, open this request again from your dashboard.  If [facility] has not replied to your request within 24 hours please contact [facility] at [phone].

## 2021-01-05 NOTE — Telephone Encounter (Signed)
PA required for Metoprolol Succinate 100 mg tablet Take 1.5 mg (150 mg) tablet by mouth in the morning.  PA has been submitted via covermymeds. Awaiting response.  [facility] has not yet replied to your PA request. You may close this dialog, return to your dashboard, and perform other tasks.  To check for an update later, open this request again from your dashboard.  If [facility] has not replied to your request within 24 hours please contact [facility] at [phone].

## 2021-01-05 NOTE — Telephone Encounter (Signed)
PA Case: [id number], Status: Approved, Coverage Starts on: 01/05/2021 12:00:00 AM, Coverage Ends on: 01/05/2022 12:00:00 AM.

## 2021-01-19 ENCOUNTER — Other Ambulatory Visit: Payer: Self-pay | Admitting: Cardiovascular Disease

## 2021-01-19 NOTE — Telephone Encounter (Signed)
Rx request sent to pharmacy.  

## 2021-02-09 ENCOUNTER — Encounter: Payer: Self-pay | Admitting: Gastroenterology

## 2021-02-09 ENCOUNTER — Other Ambulatory Visit: Payer: Self-pay

## 2021-02-09 ENCOUNTER — Ambulatory Visit (AMBULATORY_SURGERY_CENTER): Payer: BC Managed Care – PPO | Admitting: Gastroenterology

## 2021-02-09 VITALS — BP 138/67 | HR 82 | Temp 98.0°F | Resp 19 | Ht 71.0 in | Wt 290.0 lb

## 2021-02-09 DIAGNOSIS — R194 Change in bowel habit: Secondary | ICD-10-CM

## 2021-02-09 DIAGNOSIS — K5730 Diverticulosis of large intestine without perforation or abscess without bleeding: Secondary | ICD-10-CM

## 2021-02-09 DIAGNOSIS — K50818 Crohn's disease of both small and large intestine with other complication: Secondary | ICD-10-CM

## 2021-02-09 DIAGNOSIS — Z8719 Personal history of other diseases of the digestive system: Secondary | ICD-10-CM

## 2021-02-09 DIAGNOSIS — K9189 Other postprocedural complications and disorders of digestive system: Secondary | ICD-10-CM

## 2021-02-09 MED ORDER — SODIUM CHLORIDE 0.9 % IV SOLN: 500.0000 mL | Freq: Once | INTRAVENOUS | Status: DC

## 2021-02-09 NOTE — Patient Instructions (Signed)
YOU HAD AN ENDOSCOPIC PROCEDURE TODAY AT THE [facility]:   Refer to the procedure report that was given to you for any specific questions about what was found during the examination.  If the procedure report does not answer your questions, please call your gastroenterologist to clarify.  If you requested that your care partner not be given the details of your procedure findings, then the procedure report has been included in a sealed envelope for you to review at your convenience later.  YOU SHOULD EXPECT: Some feelings of bloating in the abdomen. Passage of more gas than usual.  Walking can help get rid of the air that was put into your GI tract during the procedure and reduce the bloating. If you had a lower endoscopy (such as a colonoscopy or flexible sigmoidoscopy) you may notice spotting of blood in your stool or on the toilet paper. If you underwent a bowel prep for your procedure, you may not have a normal bowel movement for a few days.  Please Note:  You might notice some irritation and congestion in your nose or some drainage.  This is from the oxygen used during your procedure.  There is no need for concern and it should clear up in a day or so.  SYMPTOMS TO REPORT IMMEDIATELY:   Following lower endoscopy (colonoscopy or flexible sigmoidoscopy):  Excessive amounts of blood in the stool  Significant tenderness or worsening of abdominal pains  Swelling of the abdomen that is new, acute  Fever of 100F or higher  For urgent or emergent issues, a gastroenterologist can be reached at any hour by calling [phone]. Do not use MyChart messaging for urgent concerns.    DIET:  We do recommend a small meal at first, but then you may proceed to your regular diet.  Drink plenty of fluids but you should avoid alcoholic beverages for 24 hours.  ACTIVITY:  You should plan to take it easy for the rest of today and you should NOT DRIVE or use heavy machinery until tomorrow (because  of the sedation medicines used during the test).    FOLLOW UP: Our staff will call the number listed on your records 48-72 hours following your procedure to check on you and address any questions or concerns that you may have regarding the information given to you following your procedure. If we do not reach you, we will leave a message.  We will attempt to reach you two times.  During this call, we will ask if you have developed any symptoms of COVID 19. If you develop any symptoms (ie: fever, flu-like symptoms, shortness of breath, cough etc.) before then, please call [phone].  If you test positive for Covid 19 in the 2 weeks post procedure, please call and report this information to [place].    If any biopsies were taken you will be contacted by phone or by my chart and /or letter within the next 1-3 weeks.  Please call us at [phone] if you have not heard about the biopsies in 3 weeks.    SIGNATURES/CONFIDENTIALITY: You and/or your care partner have signed paperwork which will be entered into your electronic medical record.  These signatures attest to the fact that that the information above on your After Visit Summary has been reviewed and is understood.  Full responsibility of the confidentiality of this discharge information lies with you and/or your care-partner.

## 2021-02-09 NOTE — Progress Notes (Signed)
Called to room to assist during endoscopic procedure.  Patient ID and intended procedure confirmed with present staff. Received instructions for my participation in the procedure from the performing physician.  

## 2021-02-09 NOTE — Progress Notes (Signed)
Handout given for diverticulosis.

## 2021-02-09 NOTE — Progress Notes (Signed)
PT taken to PACU. Monitors in place. VSS. Report given to RN. 

## 2021-02-09 NOTE — Op Note (Signed)
[facility] Patient Name: Jeffrey GreaserGregory Morales Procedure Date: 02/09/2021 7:29 AM MRN: [id number] Endoscopist: Viviann SpareSteven P. Adela LankArmbruster , MD Age: 50 Referring MD:  Date of Birth: [birth year] Gender: Male Account #: [id number] Procedure:                Colonoscopy Indications:              history of ileocecectomy in 2003 with surgical                            pathology concerning for Crohns disease, not on                            therapy, previously on NSAIDs daily for chronic                            back pain, has been held in recent months. Now on                            Citrucel and immodium with good control of symptoms Medicines:                Monitored Anesthesia Care Procedure:                Pre-Anesthesia Assessment:                           - Prior to the procedure, a History and Physical                            was performed, and patient medications and                            allergies were reviewed. The patient's tolerance of                            previous anesthesia was also reviewed. The risks                            and benefits of the procedure and the sedation                            options and risks were discussed with the patient.                            All questions were answered, and informed consent                            was obtained. Prior Anticoagulants: The patient has                            taken no previous anticoagulant or antiplatelet                            agents. ASA Grade Assessment: III - A patient with  severe systemic disease. After reviewing the risks                            and benefits, the patient was deemed in                            satisfactory condition to undergo the procedure.                           After obtaining informed consent, the colonoscope                            was passed under direct vision. Throughout the                             procedure, the patient's blood pressure, pulse, and                            oxygen saturations were monitored continuously. The                            Olympus PCF-H190DL (#[id number]) Colonoscope was                            introduced through the anus and advanced to the the                            terminal ileum. The colonoscopy was performed                            without difficulty. The patient tolerated the                            procedure well. The quality of the bowel                            preparation was adequate. The terminal ileum,                            surgical anastomosis, and the rectum were                            photographed. Scope In: 7:35:23 AM Scope Out: 7:54:39 AM Scope Withdrawal Time: 0 hours 18 minutes 27 seconds  Total Procedure Duration: 0 hours 19 minutes 16 seconds  Findings:                 The perianal and digital rectal examinations were                            normal.                           The terminal ileum was intubated and appeared  normal.                           There was evidence of a prior end-to-end                            ileo-colonic anastomosis in the ascending colon.                            This was patent and was characterized by one small                            ulceration. Biopsies were taken with a cold forceps                            for histology. Rest of the anastomosis appeared                            normal.                           Multiple medium-mouthed diverticula were found in                            the left colon.                           A patchy area of mildly erythematous mucosa was                            found in the distal rectum with small ulceration at                            the dentate line. Biopsies were taken with a cold                            forceps for histology.                           The exam was otherwise without  abnormality.                           Biopsies for histology were taken with a cold                            forceps from the right colon, left colon and                            transverse colon for evaluation of microscopic                            colitis. Complications:            No immediate complications. Estimated blood loss:  Minimal. Estimated Blood Loss:     Estimated blood loss was minimal. Impression:               - The examined portion of the ileum was normal.                           - Patent end-to-end ileo-colonic anastomosis,                            characterized by one small ulceration - could                            represent normal post operative changes vs. mild                            Crohn's disease - no significant inflammation.                            Biopsied.                           - Diverticulosis in the left colon.                           - Erythematous mucosa in the distal rectum with                            slight ulceration. Biopsied.                           - The examination was otherwise normal.                           - Biopsies were taken with a cold forceps from the                            right colon, left colon and transverse colon. Recommendation:           - Patient has a contact number available for                            emergencies. The signs and symptoms of potential                            delayed complications were discussed with the                            patient. Return to normal activities tomorrow.                            Written discharge instructions were provided to the                            patient.                           -  Resume previous diet.                           - Continue present medications.                           - Await pathology results. Viviann Spare P. Hanin Decook, MD 02/09/2021 8:02:31 AM This report has been signed electronically.

## 2021-02-11 ENCOUNTER — Telehealth: Payer: Self-pay

## 2021-02-11 NOTE — Telephone Encounter (Signed)
LVM

## 2021-03-17 ENCOUNTER — Other Ambulatory Visit: Payer: Self-pay | Admitting: Cardiovascular Disease

## 2021-04-04 ENCOUNTER — Other Ambulatory Visit: Payer: Self-pay | Admitting: Cardiovascular Disease

## 2021-04-12 ENCOUNTER — Other Ambulatory Visit: Payer: Self-pay | Admitting: Dermatology

## 2021-08-25 ENCOUNTER — Other Ambulatory Visit: Payer: Self-pay | Admitting: Gastroenterology

## 2021-08-25 ENCOUNTER — Other Ambulatory Visit: Payer: Self-pay | Admitting: Cardiovascular Disease

## 2021-09-18 ENCOUNTER — Other Ambulatory Visit: Payer: Self-pay | Admitting: Cardiovascular Disease

## 2021-09-22 ENCOUNTER — Ambulatory Visit: Payer: BC Managed Care – PPO | Admitting: Medical

## 2021-10-05 ENCOUNTER — Encounter: Payer: Self-pay | Admitting: Cardiovascular Disease

## 2021-10-05 ENCOUNTER — Other Ambulatory Visit: Payer: Self-pay

## 2021-10-05 ENCOUNTER — Ambulatory Visit: Payer: BC Managed Care – PPO | Admitting: Cardiovascular Disease

## 2021-10-05 VITALS — BP 128/90 | HR 78 | Ht 70.5 in | Wt 297.2 lb

## 2021-10-05 DIAGNOSIS — I10 Essential (primary) hypertension: Secondary | ICD-10-CM | POA: Diagnosis not present

## 2021-10-05 DIAGNOSIS — E782 Mixed hyperlipidemia: Secondary | ICD-10-CM

## 2021-10-05 DIAGNOSIS — R0602 Shortness of breath: Secondary | ICD-10-CM

## 2021-10-05 DIAGNOSIS — R002 Palpitations: Secondary | ICD-10-CM

## 2021-10-05 DIAGNOSIS — Z8342 Family history of familial hypercholesterolemia: Secondary | ICD-10-CM

## 2021-10-05 DIAGNOSIS — R000 Tachycardia, unspecified: Secondary | ICD-10-CM | POA: Diagnosis not present

## 2021-10-05 DIAGNOSIS — Z79899 Other long term (current) drug therapy: Secondary | ICD-10-CM

## 2021-10-05 MED ORDER — METOPROLOL SUCCINATE ER 100 MG PO TB24
150.0000 mg | ORAL_TABLET | Freq: Every morning | ORAL | 3 refills | Status: DC
Start: 2021-10-05 — End: 2022-09-28

## 2021-10-05 MED ORDER — METOPROLOL SUCCINATE ER 50 MG PO TB24: 50.0000 mg | ORAL_TABLET | Freq: Every evening | ORAL | 3 refills | Status: DC

## 2021-10-05 MED ORDER — METOPROLOL TARTRATE 25 MG PO TABS: ORAL_TABLET | ORAL | 3 refills | Status: DC

## 2021-10-05 NOTE — Patient Instructions (Addendum)
Medication Instructions:  No changes  If you need a refill on your cardiac medications before your next appointment, please call your pharmacy.   Lab work: Lipids, CMP, CBC, TSH  Testing/Procedures: We have order a CT coronary calcium score (you may see results on your MyChart account, but we will call via phone with the results) This is a $99 out of pocket expense   This procedure uses special x-ray equipment to produce pictures of the coronary arteries to determine if they are blocked or narrowed by the buildup of plaque - an indicator for atherosclerosis or coronary artery disease (CAD).  Please call [phone] to schedule at your earliest convince   This is done at our [facility] in [facility] [address] [place], [place] [address]  Follow-Up: At [facility], you and your health needs are our priority.  As part of our continuing mission to provide you with exceptional heart care, we have created designated Provider Care Teams.  These Care Teams include your primary Cardiologist (physician) and Advanced Practice Providers (APPs -  Physician Assistants and Nurse Practitioners) who all work together to provide you with the care you need, when you need it.  You will need a follow up appointment in 12 months  Providers on your designated Care Team:   Nicolasa Ducking, NP Eula Listen, PA-C Cadence Fransico Michael, [place]  COVID-19 Vaccine Information can be found at: [email or web address] For questions related to vaccine distribution or appointments, please email [email or web address] or call [phone].

## 2021-10-05 NOTE — Progress Notes (Signed)
Evaluation Performed:  Follow-up visit  Date:  10/05/2021   ID:  Jeffrey Morales, DOB [birth year], MRN [id number]  Patient Location:  [address] [place] [address]   Provider location:   [facility], [place] office  PCP:  Patient, No Pcp Per (Inactive)  Cardiologist:  Fonnie Mu   Chief Complaint  Patient presents with   Post Covid concerns     Patient c/o post Covid concerns. Medications reviewed by the patient verbally.      History of Present Illness:    Jeffrey Morales is a 50 y.o. male  past medical history of obesity,  elevated triglycerides,  history of tachycardia,  Previously seen at  [facility] with symptoms of dehydration, tachycardia . History of partial colectomy in the past, part of small bowel, part of large intestine  previous difficulty maintaining hydration given previous GI surgery  CT coronary calcium score of 0 Echo 09/2015: normal, EF 60% hypertension who presents For routine follow-up of his tachycardia and hypertension  Covid December 2020 Covid aug 2022 Most recent infection with symptoms of cough and congestion, poor energy x1 month Energy starting to come back 3 miles of walking   On crestor 5 mg daily, no side effects  Heading to [place] in several weeks time No primary care, requesting lab work Blood pressure typically well controlled at home  Stressors, continues to work at hospital in [facility]  EKG personally reviewed by myself on todays visit NSR rate 78 no significant ST or T wave changes  Labs reviewed Lab Results  Component Value Date   CHOL 211 (H) 12/20/2020   HDL 52 12/20/2020   LDLCALC 110 (H) 12/20/2020   TRIG 283 (H) 12/20/2020     Past Medical History:  Diagnosis Date   Allergy    Bulging lumbar disc    L1-S1   Chronic diarrhea    Diverticulosis    Essential hypertension    GERD (gastroesophageal reflux disease)    Hyperlipidemia    PVC (premature ventricular  contraction)    no problems currently   Reflux    Small bowel perforation (HCC)    in 2003, with abscess and inflammatory mass, path c/w Crohns   Past Surgical History:  Procedure Laterality Date   COLONOSCOPY     LAPAROSCOPIC ILEOCECECTOMY     MANDIBLE FRACTURE SURGERY     WISDOM TOOTH EXTRACTION        Allergies:   Clarithromycin and Penicillins   Social History   Tobacco Use   Smoking status: Never   Smokeless tobacco: Never  Vaping Use   Vaping Use: Never used  Substance Use Topics   Alcohol use: No    Alcohol/week: 0.0 standard drinks   Drug use: No     Current Outpatient Medications on File Prior to Visit  Medication Sig Dispense Refill   aspirin EC 81 MG tablet Take 81 mg by mouth daily.     b complex vitamins tablet Take 1 tablet by mouth daily.     Choline Fenofibrate (FENOFIBRIC ACID) 135 MG CPDR TAKE 1 CAPSULE BY MOUTH EVERY DAY 90 capsule 2   famotidine (PEPCID) 20 MG tablet TAKE 1 TABLET BY MOUTH 1 TO 2 TIMES DAILY AS NEEDED 90 tablet 3   loratadine (CLARITIN) 10 MG tablet Take 10 mg by mouth daily.     Magnesium 250 MG TABS Take 1 tablet by mouth daily.     methylcellulose (CITRUCEL) oral powder Take  daily as directed     metoprolol succinate (TOPROL-XL) 100 MG 24 hr tablet TAKE 1.5 TABLETS (150 MG TOTAL) BY MOUTH IN THE MORNING. TAKE WITH OR IMMEDIATELY FOLLOWING A MEAL. 135 tablet 2   metoprolol succinate (TOPROL-XL) 50 MG 24 hr tablet Take 1 tablet (50 mg total) by mouth every evening. Take with or immediately following a meal. 90 tablet 2   metoprolol tartrate (LOPRESSOR) 25 MG tablet TAKE 1 TABLET BY MOUTH 2 TIMES DAILY AS NEEDED. 60 tablet 1   Multiple Vitamins-Minerals (ZINC PO) Take by mouth daily.     POTASSIUM CHLORIDE PO Take by mouth.     rosuvastatin (CRESTOR) 5 MG tablet Take 1 tablet (5 mg total) by mouth daily. 90 tablet 3   VITAMIN D PO Take by mouth daily.     No current facility-administered medications on file prior to visit.      Family Hx: The patient's family history includes Hypertension in his father; Lymphoma in his father. There is no history of Colon cancer, Esophageal cancer, Rectal cancer, or Stomach cancer.  ROS:   Please see the history of present illness.    Review of Systems  Constitutional: Negative.   HENT: Negative.    Respiratory: Negative.    Cardiovascular: Negative.   Gastrointestinal: Negative.   Musculoskeletal: Negative.   Neurological: Negative.   Psychiatric/Behavioral: Negative.    All other systems reviewed and are negative.   Labs/Other Tests and Data Reviewed:    Recent Labs: 10/27/2020: ALT 30; Hemoglobin 15.8; Platelets 272.0 12/20/2020: BUN 17; Creatinine, Ser 1.01; Potassium 4.6; Sodium 138; TSH 1.160   Recent Lipid Panel Lab Results  Component Value Date/Time   CHOL 211 (H) 12/20/2020 09:29 AM   TRIG 283 (H) 12/20/2020 09:29 AM   HDL 52 12/20/2020 09:29 AM   CHOLHDL 4.1 12/20/2020 09:29 AM   CHOLHDL 4.3 11/17/2016 01:04 PM   LDLCALC 110 (H) 12/20/2020 09:29 AM    Wt Readings from Last 3 Encounters:  10/05/21 297 lb 4 oz (134.8 kg)  02/09/21 290 lb (131.5 kg)  12/20/20 300 lb (136.1 kg)     Exam:    BP 128/90 (BP Location: Left Arm, Patient Position: Sitting, Cuff Size: Large)   Pulse 78   Ht 5' 10.5" (1.791 m)   Wt 297 lb 4 oz (134.8 kg)   SpO2 98%   BMI 42.05 kg/m  Constitutional:  oriented to person, place, and time. No distress.  HENT:  Head: Grossly normal Eyes:  no discharge. No scleral icterus.  Neck: No JVD, no carotid bruits  Cardiovascular: Regular rate and rhythm, no murmurs appreciated Pulmonary/Chest: Clear to auscultation bilaterally, no wheezes or rails Abdominal: Soft.  no distension.  no tenderness.  Musculoskeletal: Normal range of motion Neurological:  normal muscle tone. Coordination normal. No atrophy Skin: Skin warm and dry Psychiatric: normal affect, pleasant  ASSESSMENT & PLAN:    Problem List Items Addressed This Visit      Sinus tachycardia - Primary   Palpitations   Other Visit Diagnoses     Essential hypertension       Shortness of breath       Mixed hyperlipidemia         Essential hypertension Blood pressure is well controlled on today's visit. No changes made to the medications.  S/p colectomy Takes fiber, stable sx   Sinus tachycardia  on metoprolol succinate 150 in the morning Continue 50 mg in the p.m. Bp stable   Class 1 obesity due  to excess calories without serious comorbidity with body mass index (BMI) of 30.0 to 30.9 in adult Exercise and diet restriction recommended Walking at least 3 miles per day  Hyperlipidemia Crestor 5 mg daily Coronary calcium score of 0 , in 2017 Requesting repeat, 1 has been ordered  S/p covid: In 2020 Recovered    Total encounter time more than 25 minutes  Greater than 50% was spent in counseling and coordination of care with the patient  Signed, Julien Nordmann, MD  [facility] [address], [place], [place] [address]

## 2021-10-06 LAB — LIPID PANEL
Chol/HDL Ratio: 3.6 ratio (ref 0.0–5.0)
Cholesterol, Total: 149 mg/dL (ref 100–199)
HDL: 41 mg/dL (ref >39)
LDL Chol Calc (NIH): 68 mg/dL (ref 0–99)
Triglycerides: 246 mg/dL — ABNORMAL HIGH (ref 0–149)
VLDL Cholesterol Cal: 40 mg/dL (ref 5–40)

## 2021-10-06 LAB — CBC
Hematocrit: 42.9 % (ref 37.5–51.0)
Hemoglobin: 14.3 g/dL (ref 13.0–17.7)
MCH: 28.5 pg (ref 26.6–33.0)
MCHC: 33.3 g/dL (ref 31.5–35.7)
MCV: 86 fL (ref 79–97)
Platelets: 270 10*3/uL (ref 150–450)
RBC: 5.02 x10E6/uL (ref 4.14–5.80)
RDW: 14.5 % (ref 11.6–15.4)
WBC: 6.1 10*3/uL (ref 3.4–10.8)

## 2021-10-06 LAB — COMPREHENSIVE METABOLIC PANEL
ALT: 27 IU/L (ref 0–44)
AST: 24 IU/L (ref 0–40)
Albumin/Globulin Ratio: 2.1 (ref 1.2–2.2)
Albumin: 4.7 g/dL (ref 4.0–5.0)
Alkaline Phosphatase: 46 IU/L (ref 44–121)
BUN/Creatinine Ratio: 16 (ref 9–20)
BUN: 17 mg/dL (ref 6–24)
Bilirubin Total: 0.5 mg/dL (ref 0.0–1.2)
CO2: 22 mmol/L (ref 20–29)
Calcium: 9.5 mg/dL (ref 8.7–10.2)
Chloride: 101 mmol/L (ref 96–106)
Creatinine, Ser: 1.05 mg/dL (ref 0.76–1.27)
Globulin, Total: 2.2 g/dL (ref 1.5–4.5)
Glucose: 92 mg/dL (ref 70–99)
Potassium: 4.5 mmol/L (ref 3.5–5.2)
Sodium: 136 mmol/L (ref 134–144)
Total Protein: 6.9 g/dL (ref 6.0–8.5)
eGFR: 86 mL/min/{1.73_m2} (ref >59)

## 2021-10-06 LAB — TSH: TSH: 1.16 u[IU]/mL (ref 0.450–4.500)

## 2021-10-13 ENCOUNTER — Other Ambulatory Visit: Payer: Self-pay

## 2021-10-13 ENCOUNTER — Ambulatory Visit (HOSPITAL_BASED_OUTPATIENT_CLINIC_OR_DEPARTMENT_OTHER)
Admission: RE | Admit: 2021-10-13 | Discharge: 2021-10-13 | Disposition: A | Discharge status: Home / Self Care | Payer: BC Managed Care – PPO | Source: Ambulatory Visit | Attending: Cardiovascular Disease | Admitting: Cardiovascular Disease

## 2021-10-13 ENCOUNTER — Encounter (HOSPITAL_BASED_OUTPATIENT_CLINIC_OR_DEPARTMENT_OTHER): Payer: Self-pay

## 2021-10-13 DIAGNOSIS — Z8342 Family history of familial hypercholesterolemia: Secondary | ICD-10-CM | POA: Insufficient documentation

## 2021-10-13 DIAGNOSIS — E782 Mixed hyperlipidemia: Secondary | ICD-10-CM | POA: Insufficient documentation

## 2021-10-25 ENCOUNTER — Telehealth: Payer: Self-pay

## 2021-10-25 NOTE — Telephone Encounter (Signed)
Left detail message on VM of pt's recent results okay by DPR, Dr. Mariah Milling advised   "CT coronary calcium score  Score of 0  Excellent finding, indicating no calcified coronary atherosclerotic plaque noted  No other significant findings on the scan were picked up "  At this time, no further recommendations or medications changes, advised to call office for any concerns or questions, otherwise will see at next visit.   Results released to MyChart by Dr. Mariah Milling Written by Antonieta Iba, MD on 10/25/2021  7:41 AM EST Seen by patient Jeffrey Morales on 10/25/2021  7:52 AM

## 2021-12-26 ENCOUNTER — Other Ambulatory Visit: Payer: Self-pay | Admitting: Cardiovascular Disease

## 2022-01-04 ENCOUNTER — Other Ambulatory Visit: Payer: Self-pay | Admitting: Cardiovascular Disease

## 2022-02-25 IMAGING — CT CT CARDIAC CORONARY ARTERY CALCIUM SCORE
3 series · 14 of 20 positions shown, 16 images · non-contrast
Comparison: None.
COMPARISON: None.

Addendum:
EXAM:
OVER-READ INTERPRETATION  CT CHEST

The following report is an over-read performed by radiologist Dr.
Budu Alagan [REDACTED] on 10/13/2021. This
over-read does not include interpretation of cardiac or coronary
anatomy or pathology. The coronary calcium score interpretation by
the cardiologist is attached.
CLINICAL DATA: Risk stratification
Coronary Calcium Score
TECHNIQUE: The patient was scanned on a Siemens Somatom 64 slice scanner. Axial
non-contrast 3 mm slices were carried out through the heart. The
data set was analyzed on a dedicated work station and scored using
the Agatson method.

[Series 2: ax lung · axial · 0.98mm/px · z∈[+1269,+1381]mm · 5 of 86 slices shown]
[im 15/86  lung]
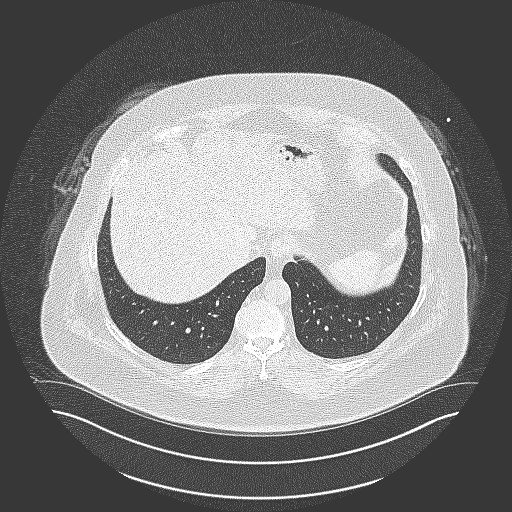
[im 29/86  lung]
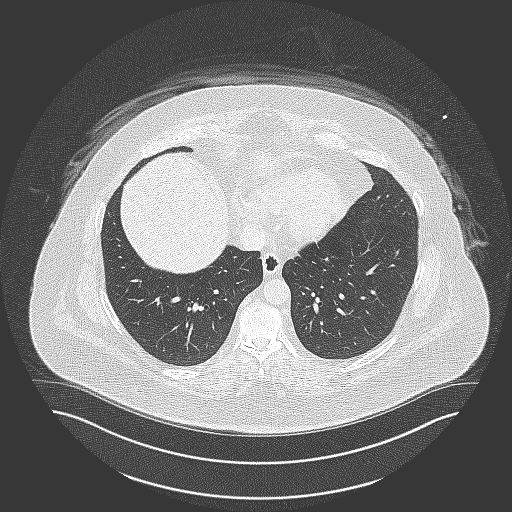
[im 43/86  lung]
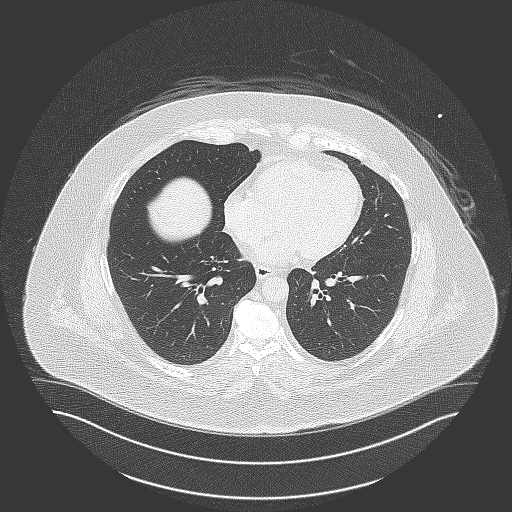
[im 57/86  lung]
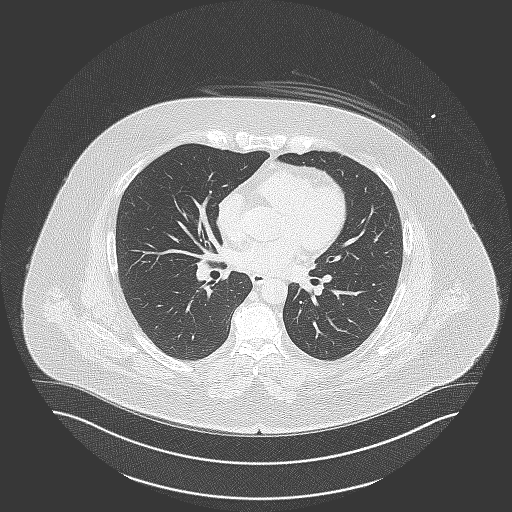
[im 71/86  lung]
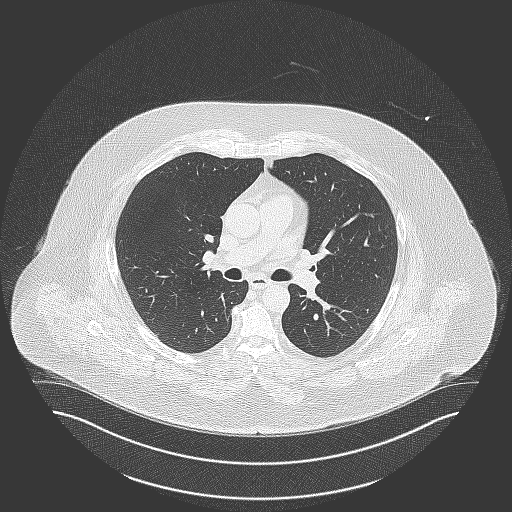

[Series 3: cascseq 3.0 sa36 70% (id) · axial · 0.39mm/px · z∈[+1284,+1368]mm · 3 of 57 slices shown]
[im 15/57  vessel]
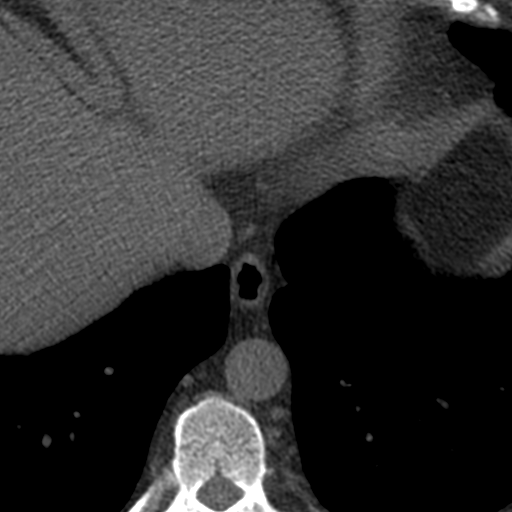
[im 29/57  vessel]
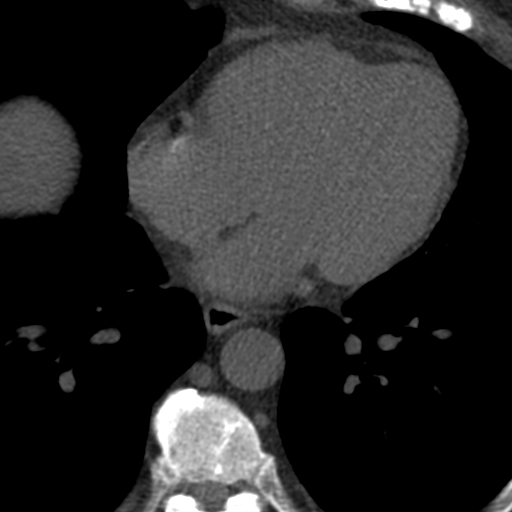
[im 43/57  vessel]
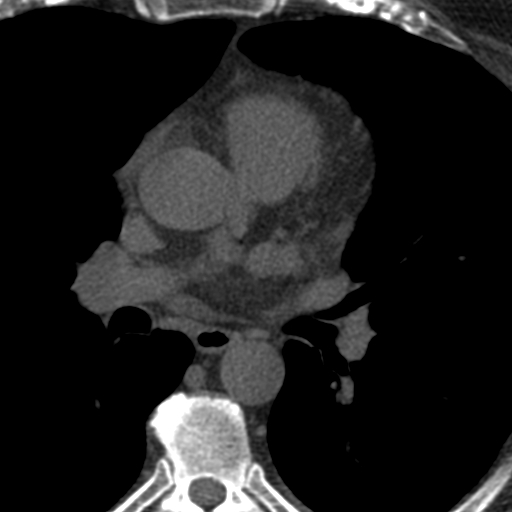

[Series 4: ax st · axial · 0.98mm/px · z∈[+1265,+1385]mm · 6 of 86 slices shown, 8 images]
[im 13/86  vessel]
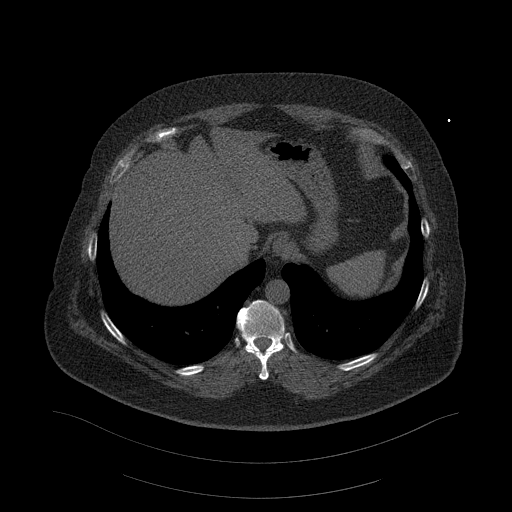
[im 13/86  lung]
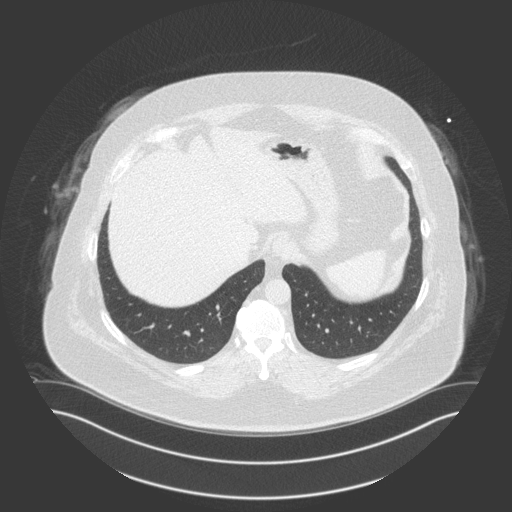
[im 25/86  vessel]
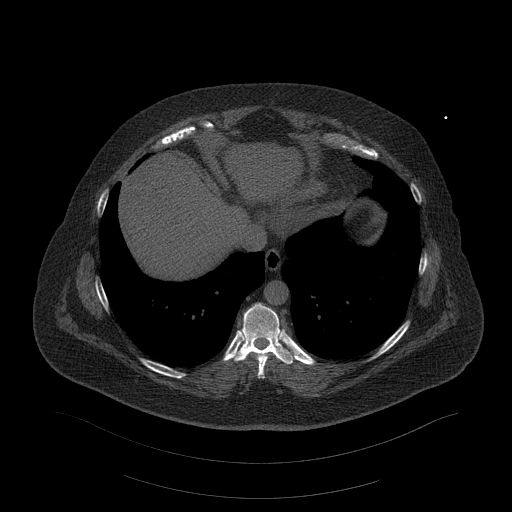
[im 37/86  vessel]
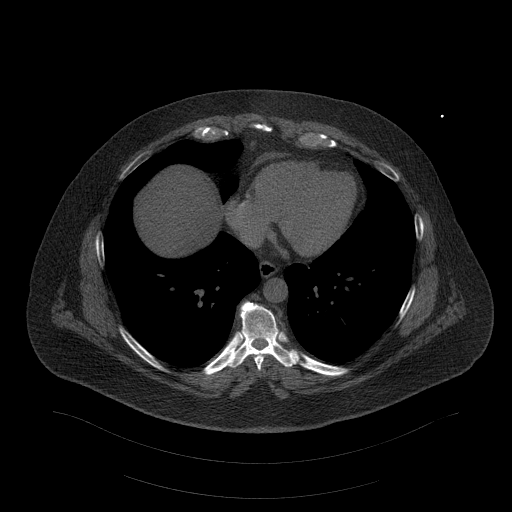
[im 49/86  vessel]
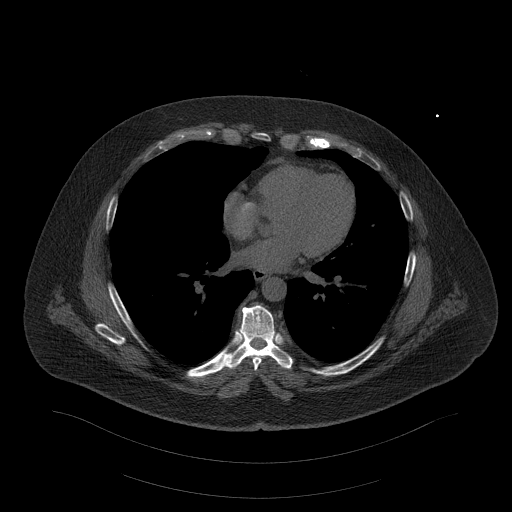
[im 61/86  vessel]
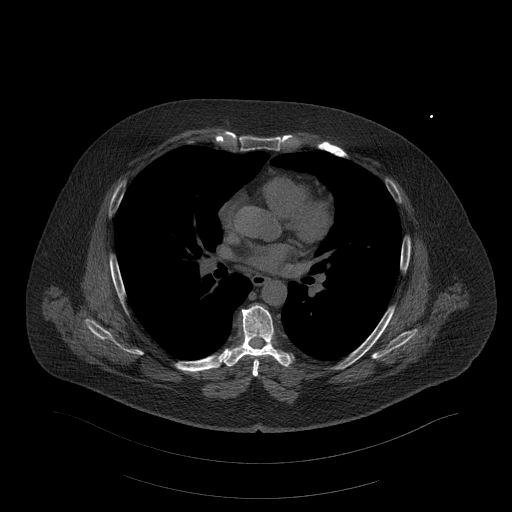
[im 61/86  lung]
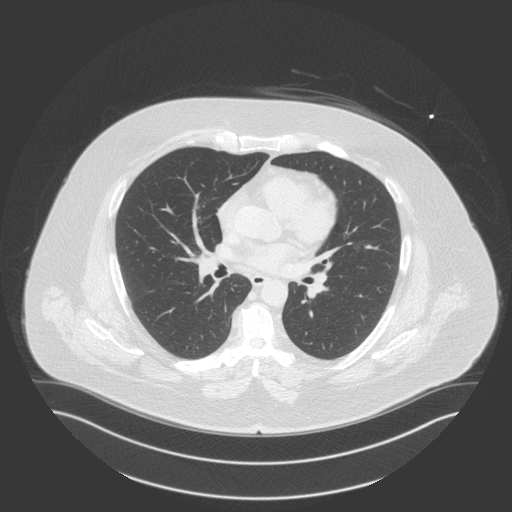
[im 73/86  vessel]
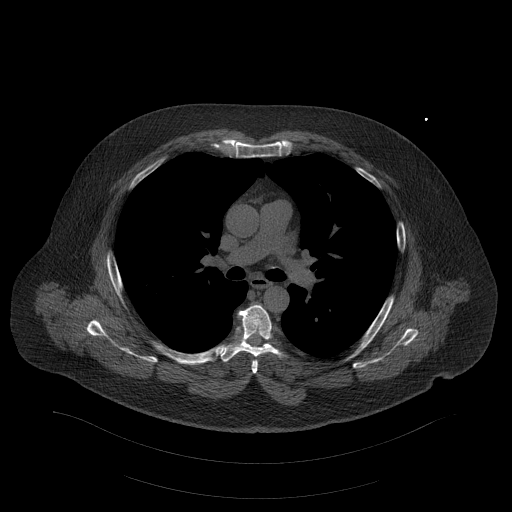

[14 of 20 positions shown; findings below may reference images not displayed]

FINDINGS: Within the visualized portions of the thorax there are no suspicious
appearing pulmonary nodules or masses, there is no acute
consolidative airspace disease, no pleural effusions, no
pneumothorax and no lymphadenopathy. Visualized portions of the
upper abdomen are unremarkable. There are no aggressive appearing
lytic or blastic lesions noted in the visualized portions of the
skeleton.
IMPRESSION: 1. No significant incidental noncardiac findings are noted.
FINDINGS: Non-cardiac: See separate report from [REDACTED].

Ascending aorta: Normal diameter 3.1 cm

Pericardium: Normal

Coronary arteries: No calcium noted
IMPRESSION: Coronary calcium score of 0.

Markeisha Iorio

*** End of Addendum ***
EXAM:
OVER-READ INTERPRETATION  CT CHEST

The following report is an over-read performed by radiologist Dr.
Budu Alagan [REDACTED] on 10/13/2021. This
over-read does not include interpretation of cardiac or coronary
anatomy or pathology. The coronary calcium score interpretation by
the cardiologist is attached.
FINDINGS: Within the visualized portions of the thorax there are no suspicious
appearing pulmonary nodules or masses, there is no acute
consolidative airspace disease, no pleural effusions, no
pneumothorax and no lymphadenopathy. Visualized portions of the
upper abdomen are unremarkable. There are no aggressive appearing
lytic or blastic lesions noted in the visualized portions of the
skeleton.
IMPRESSION: 1. No significant incidental noncardiac findings are noted.

## 2022-04-16 ENCOUNTER — Other Ambulatory Visit: Payer: Self-pay | Admitting: Gastroenterology

## 2022-07-16 ENCOUNTER — Other Ambulatory Visit: Payer: Self-pay | Admitting: Gastroenterology

## 2022-09-25 ENCOUNTER — Other Ambulatory Visit: Payer: Self-pay | Admitting: Cardiovascular Disease

## 2022-09-25 NOTE — Telephone Encounter (Signed)
Please contact patient for 12 month follow up, last seen 10-05-2021. Refills pending follow up.  Thank you.

## 2022-09-29 ENCOUNTER — Other Ambulatory Visit: Payer: Self-pay | Admitting: Cardiovascular Disease

## 2022-10-12 ENCOUNTER — Other Ambulatory Visit: Payer: Self-pay | Admitting: Gastroenterology

## 2022-10-21 ENCOUNTER — Other Ambulatory Visit: Payer: Self-pay | Admitting: Cardiovascular Disease

## 2022-10-26 ENCOUNTER — Other Ambulatory Visit: Payer: Self-pay | Admitting: Gastroenterology

## 2022-11-20 ENCOUNTER — Ambulatory Visit: Payer: BC Managed Care – PPO | Admitting: Cardiovascular Disease

## 2022-12-20 ENCOUNTER — Ambulatory Visit
Payer: No Typology Code available for payment source | Attending: Cardiovascular Disease | Admitting: Cardiovascular Disease

## 2022-12-20 ENCOUNTER — Encounter: Payer: Self-pay | Admitting: Cardiovascular Disease

## 2022-12-20 VITALS — BP 132/88 | HR 74 | Ht 71.0 in | Wt 286.1 lb

## 2022-12-20 DIAGNOSIS — R0602 Shortness of breath: Secondary | ICD-10-CM

## 2022-12-20 DIAGNOSIS — E782 Mixed hyperlipidemia: Secondary | ICD-10-CM

## 2022-12-20 DIAGNOSIS — R000 Tachycardia, unspecified: Secondary | ICD-10-CM

## 2022-12-20 DIAGNOSIS — R002 Palpitations: Secondary | ICD-10-CM

## 2022-12-20 DIAGNOSIS — I10 Essential (primary) hypertension: Secondary | ICD-10-CM | POA: Diagnosis not present

## 2022-12-20 MED ORDER — FENOFIBRIC ACID 135 MG PO CPDR: 1.0000 | DELAYED_RELEASE_CAPSULE | Freq: Every day | ORAL | 3 refills | Status: AC

## 2022-12-20 MED ORDER — HYDROCHLOROTHIAZIDE 25 MG PO TABS: 25.0000 mg | ORAL_TABLET | Freq: Every day | ORAL | 3 refills | Status: DC | PRN

## 2022-12-20 MED ORDER — ROSUVASTATIN CALCIUM 5 MG PO TABS: 5.0000 mg | ORAL_TABLET | Freq: Every day | ORAL | 3 refills | Status: DC

## 2022-12-20 MED ORDER — FAMOTIDINE 20 MG PO TABS: 20.0000 mg | ORAL_TABLET | Freq: Two times a day (BID) | ORAL | 3 refills | Status: DC

## 2022-12-20 MED ORDER — METOPROLOL TARTRATE 25 MG PO TABS: 25.0000 mg | ORAL_TABLET | Freq: Two times a day (BID) | ORAL | 3 refills | Status: DC | PRN

## 2022-12-20 MED ORDER — METOPROLOL SUCCINATE ER 100 MG PO TB24: ORAL_TABLET | ORAL | 3 refills | Status: DC

## 2022-12-20 MED ORDER — METOPROLOL SUCCINATE ER 50 MG PO TB24: ORAL_TABLET | ORAL | 3 refills | Status: DC

## 2022-12-20 NOTE — Progress Notes (Signed)
Evaluation Performed:  Follow-up visit  Date:  12/20/2022   ID:  Jeffrey Morales, DOB [birth year], MRN [id number]  Patient Location:  [place] [address]   Provider location:   [facility], [place] office  PCP:  Patient, No Pcp Per  Cardiologist:  Patsy Baltimore   Chief Complaint  Patient presents with   12 month follow up     "Doing well." Medications reviewed by the patient verbally.     History of Present Illness:    Jeffrey Morales is a 52 y.o. male  past medical history of obesity,  elevated triglycerides,  history of tachycardia,  Previously seen at  [facility] with symptoms of dehydration, tachycardia . History of partial colectomy in the past, part of small bowel, part of large intestine  previous difficulty maintaining hydration given previous GI surgery  CT coronary calcium score of 0 Echo 09/2015: normal, EF 60% hypertension who presents For routine follow-up of his tachycardia and hypertension  Last seen by myself November 2022  In follow-up reports he is doing well Continues to work in hospital system in [place]  No significant shortness of breath on exertion, no chest pain concerning for angina Tachycardia symptoms relatively well-controlled Has decrease metoprolol succinate back to 100 in the morning, 25 mg in the p.m. which seems to help control it No significant side effects on Crestor Requesting lab work  Reports that he works out at home Renovating 3 floor house  Covid December 2020 Covid aug 2022  EKG personally reviewed by myself on todays visit NSR rate 74 no significant ST or T wave changes  Labs reviewed Lab Results  Component Value Date   CHOL 149 10/05/2021   HDL 41 10/05/2021   LDLCALC 68 10/05/2021   TRIG 246 (H) 10/05/2021     Past Medical History:  Diagnosis Date   Allergy    Bulging lumbar disc    L1-S1   Chronic diarrhea    Diverticulosis    Essential hypertension    GERD  (gastroesophageal reflux disease)    Hyperlipidemia    PVC (premature ventricular contraction)    no problems currently   Reflux    Small bowel perforation ([place])    in 2003, with abscess and inflammatory mass, path c/w Crohns   Past Surgical History:  Procedure Laterality Date   COLONOSCOPY     LAPAROSCOPIC ILEOCECECTOMY     MANDIBLE FRACTURE SURGERY     WISDOM TOOTH EXTRACTION        Allergies:   Clarithromycin and Penicillins   Social History   Tobacco Use   Smoking status: Never   Smokeless tobacco: Never  Vaping Use   Vaping Use: Never used  Substance Use Topics   Alcohol use: No    Alcohol/week: 0.0 standard drinks of alcohol   Drug use: No     Current Outpatient Medications on File Prior to Visit  Medication Sig Dispense Refill   aspirin EC 81 MG tablet Take 81 mg by mouth daily.     b complex vitamins tablet Take 1 tablet by mouth daily.     Choline Fenofibrate (FENOFIBRIC ACID) 135 MG CPDR TAKE 1 CAPSULE BY MOUTH EVERY DAY 90 capsule 0   famotidine (PEPCID) 20 MG tablet TAKE 1 TABLET BY MOUTH TWICE DAILY **PLEASE SCHEDULE A YEARLY FOLLOW UP. THANK YOU 90 tablet 0   loratadine (CLARITIN) 10 MG tablet Take 10 mg by mouth daily.  Magnesium 250 MG TABS Take 1 tablet by mouth daily.     methylcellulose (CITRUCEL) oral powder Take daily as directed     metoprolol succinate (TOPROL-XL) 100 MG 24 hr tablet TAKE 1.5 TABLETS BY MOUTH IN THE MORNING. TAKE WITH OR IMMEDIATELY FOLLOWING A MEAL. 135 tablet 3   metoprolol succinate (TOPROL-XL) 50 MG 24 hr tablet TAKE 1 TABLET BY MOUTH EVERY EVENING. TAKE WITH OR IMMEDIATELY FOLLOWING A MEAL. 90 tablet 3   metoprolol tartrate (LOPRESSOR) 25 MG tablet TAKE 1 TABLET BY MOUTH 2 TIMES DAILY AS NEEDED for palpitations 60 tablet 3   Multiple Vitamins-Minerals (ZINC PO) Take by mouth daily.     POTASSIUM CHLORIDE PO Take by mouth.     rosuvastatin (CRESTOR) 5 MG tablet TAKE 1 TABLET (5 MG TOTAL) BY MOUTH DAILY. 90 tablet 0    VITAMIN D PO Take by mouth daily.     No current facility-administered medications on file prior to visit.     Family Hx: The patient's family history includes Hypertension in his father; Lymphoma in his father. There is no history of Colon cancer, Esophageal cancer, Rectal cancer, or Stomach cancer.  ROS:   Please see the history of present illness.    Review of Systems  Constitutional: Negative.   HENT: Negative.    Respiratory: Negative.    Cardiovascular: Negative.   Gastrointestinal: Negative.   Musculoskeletal: Negative.   Neurological: Negative.   Psychiatric/Behavioral: Negative.    All other systems reviewed and are negative.    Labs/Other Tests and Data Reviewed:    Recent Labs: No results found for requested labs within last 365 days.   Recent Lipid Panel Lab Results  Component Value Date/Time   CHOL 149 10/05/2021 03:01 PM   TRIG 246 (H) 10/05/2021 03:01 PM   HDL 41 10/05/2021 03:01 PM   CHOLHDL 3.6 10/05/2021 03:01 PM   CHOLHDL 4.3 11/17/2016 01:04 PM   LDLCALC 68 10/05/2021 03:01 PM    Wt Readings from Last 3 Encounters:  12/20/22 286 lb 2 oz (129.8 kg)  10/05/21 297 lb 4 oz (134.8 kg)  02/09/21 290 lb (131.5 kg)     Exam:    BP 132/88 (BP Location: Left Arm, Patient Position: Sitting, Cuff Size: Large)   Pulse 74   Ht 5\' 11"  (1.803 m)   Wt 286 lb 2 oz (129.8 kg)   SpO2 98%   BMI 39.91 kg/m  Constitutional:  oriented to person, place, and time. No distress.  HENT:  Head: Grossly normal Eyes:  no discharge. No scleral icterus.  Neck: No JVD, no carotid bruits  Cardiovascular: Regular rate and rhythm, no murmurs appreciated Pulmonary/Chest: Clear to auscultation bilaterally, no wheezes or rails Abdominal: Soft.  no distension.  no tenderness.  Musculoskeletal: Normal range of motion Neurological:  normal muscle tone. Coordination normal. No atrophy Skin: Skin warm and dry Psychiatric: normal affect, pleasant   ASSESSMENT & PLAN:     Problem List Items Addressed This Visit     Sinus tachycardia - Primary   Palpitations   Other Visit Diagnoses     Essential hypertension       Shortness of breath       Mixed hyperlipidemia         Essential hypertension Blood pressure is well controlled on today's visit. No changes made to the medications.  S/p colectomy Takes fiber, stable sx Needs to stay hydrated   Sinus tachycardia  on metoprolol succinate 100 in the morning, previously on  150 Continue 25 mg in the p.m., previously on 50 Decreased the dose  slightly on his own to see how it goes, so far okay on lower doses   Class 1 obesity due to excess calories without serious comorbidity with body mass index (BMI) of 30.0 to 30.9 in adult We have encouraged continued exercise, careful diet management in an effort to lose weight.  Hyperlipidemia Crestor 5 mg daily Coronary calcium score of 0 , in 2017  lipid panel ordered, previously well-controlled in 2022  S/p covid: In 2020    Total encounter time more than 30 minutes  Greater than 50% was spent in counseling and coordination of care with the patient  Signed, Ida Rogue, [place] [address], [place], [place] [address]

## 2022-12-20 NOTE — Patient Instructions (Addendum)
Medication Instructions:  No changes  If you need a refill on your cardiac medications before your next appointment, please call your pharmacy.    Lab work: [facility]:"slip to go"  lipids, TSH, CMP, CBC   Testing/Procedures: No new testing needed   Follow-Up: At [facility], you and your health needs are our priority.  As part of our continuing mission to provide you with exceptional heart care, we have created designated Provider Care Teams.  These Care Teams include your primary Cardiologist (physician) and Advanced Practice Providers (APPs -  Physician Assistants and Nurse Practitioners) who all work together to provide you with the care you need, when you need it.  You will need a follow up appointment in 12 months  Providers on your designated Care Team:   Murray Hodgkins, NP Christell Faith, PA-C Cadence Kathlen Mody, [place]  COVID-19 Vaccine Information can be found at: [email or web address] For questions related to vaccine distribution or appointments, please email [email or web address] or call [phone].

## 2022-12-21 LAB — CBC
Hematocrit: 44.1 % (ref 37.5–51.0)
Hemoglobin: 14.5 g/dL (ref 13.0–17.7)
MCH: 28.7 pg (ref 26.6–33.0)
MCHC: 32.9 g/dL (ref 31.5–35.7)
MCV: 87 fL (ref 79–97)
Platelets: 272 10*3/uL (ref 150–450)
RBC: 5.05 x10E6/uL (ref 4.14–5.80)
RDW: 13.9 % (ref 11.6–15.4)
WBC: 5.8 10*3/uL (ref 3.4–10.8)

## 2022-12-21 LAB — LIPID PANEL
Chol/HDL Ratio: 2.8 ratio (ref 0.0–5.0)
Cholesterol, Total: 130 mg/dL (ref 100–199)
HDL: 47 mg/dL (ref >39)
LDL Chol Calc (NIH): 59 mg/dL (ref 0–99)
Triglycerides: 137 mg/dL (ref 0–149)
VLDL Cholesterol Cal: 24 mg/dL (ref 5–40)

## 2022-12-21 LAB — COMPREHENSIVE METABOLIC PANEL
ALT: 26 IU/L (ref 0–44)
AST: 23 IU/L (ref 0–40)
Albumin/Globulin Ratio: 2.2 (ref 1.2–2.2)
Albumin: 4.7 g/dL (ref 3.8–4.9)
Alkaline Phosphatase: 51 IU/L (ref 44–121)
BUN/Creatinine Ratio: 14 (ref 9–20)
BUN: 13 mg/dL (ref 6–24)
Bilirubin Total: 0.4 mg/dL (ref 0.0–1.2)
CO2: 22 mmol/L (ref 20–29)
Calcium: 9.6 mg/dL (ref 8.7–10.2)
Chloride: 104 mmol/L (ref 96–106)
Creatinine, Ser: 0.93 mg/dL (ref 0.76–1.27)
Globulin, Total: 2.1 g/dL (ref 1.5–4.5)
Glucose: 103 mg/dL — ABNORMAL HIGH (ref 70–99)
Potassium: 5 mmol/L (ref 3.5–5.2)
Sodium: 143 mmol/L (ref 134–144)
Total Protein: 6.8 g/dL (ref 6.0–8.5)
eGFR: 99 mL/min/{1.73_m2} (ref >59)

## 2022-12-21 LAB — TSH: TSH: 0.566 u[IU]/mL (ref 0.450–4.500)

## 2023-01-22 ENCOUNTER — Other Ambulatory Visit: Payer: Self-pay | Admitting: Gastroenterology

## 2023-03-19 ENCOUNTER — Other Ambulatory Visit: Payer: Self-pay | Admitting: Cardiovascular Disease

## 2023-04-18 ENCOUNTER — Ambulatory Visit (INDEPENDENT_AMBULATORY_CARE_PROVIDER_SITE_OTHER): Payer: No Typology Code available for payment source | Admitting: Dermatology

## 2023-04-18 ENCOUNTER — Encounter: Payer: Self-pay | Admitting: Dermatology

## 2023-04-18 VITALS — BP 148/95

## 2023-04-18 DIAGNOSIS — Z85828 Personal history of other malignant neoplasm of skin: Secondary | ICD-10-CM

## 2023-04-18 DIAGNOSIS — L578 Other skin changes due to chronic exposure to nonionizing radiation: Secondary | ICD-10-CM

## 2023-04-18 DIAGNOSIS — W908XXA Exposure to other nonionizing radiation, initial encounter: Secondary | ICD-10-CM

## 2023-04-18 DIAGNOSIS — Z7189 Other specified counseling: Secondary | ICD-10-CM

## 2023-04-18 DIAGNOSIS — D2371 Other benign neoplasm of skin of right lower limb, including hip: Secondary | ICD-10-CM

## 2023-04-18 DIAGNOSIS — L814 Other melanin hyperpigmentation: Secondary | ICD-10-CM

## 2023-04-18 DIAGNOSIS — X32XXXA Exposure to sunlight, initial encounter: Secondary | ICD-10-CM | POA: Diagnosis not present

## 2023-04-18 DIAGNOSIS — L821 Other seborrheic keratosis: Secondary | ICD-10-CM

## 2023-04-18 DIAGNOSIS — L905 Scar conditions and fibrosis of skin: Secondary | ICD-10-CM

## 2023-04-18 DIAGNOSIS — Z5111 Encounter for antineoplastic chemotherapy: Secondary | ICD-10-CM

## 2023-04-18 DIAGNOSIS — Z1283 Encounter for screening for malignant neoplasm of skin: Secondary | ICD-10-CM

## 2023-04-18 DIAGNOSIS — L570 Actinic keratosis: Secondary | ICD-10-CM | POA: Diagnosis not present

## 2023-04-18 MED ORDER — FLUOROURACIL 5 % EX CREA: TOPICAL_CREAM | Freq: Two times a day (BID) | CUTANEOUS | 1 refills | Status: AC

## 2023-04-18 NOTE — Progress Notes (Signed)
New Patient Visit   Subjective  Jeffrey Morales is a 52 y.o. male who presents for the following: check spots, face, L lower leg, back, ones on face tender, hx of BCCs   The following portions of the chart were reviewed this encounter and updated as appropriate: medications, allergies, medical history  Review of Systems:  No other skin or systemic complaints except as noted in HPI or Assessment and Plan.  Objective  Well appearing patient in no apparent distress; mood and affect are within normal limits.  A focused examination was performed of the following areas: Face, trunk, arms, legs  Relevant exam findings are noted in the Assessment and Plan.  L forhead x 2, R upper eyebrow x 1, L cheek x 3, L preauricular x 1 R cheek x 5, L forearm below elbow x 2 (14) Pink scaly macules    Assessment & Plan   HISTORY OF BASAL CELL CARCINOMA OF THE SKIN - No evidence of recurrence today - Recommend regular full body skin exams - Recommend daily broad spectrum sunscreen SPF 30+ to sun-exposed areas, reapply every 2 hours as needed.  - Call if any new or changing lesions are noted between office visits  - L lat pectoral costal, L costal/lat pectoral, L prox tricep at junction of deltoid   AK (actinic keratosis) (14) L forhead x 2, R upper eyebrow x 1, L cheek x 3, L preauricular x 1 R cheek x 5, L forearm below elbow x 2  Vs ISK  Actinic keratoses are precancerous spots that appear secondary to cumulative UV radiation exposure/sun exposure over time. They are chronic with expected duration over 1 year. A portion of actinic keratoses will progress to squamous cell carcinoma of the skin. It is not possible to reliably predict which spots will progress to skin cancer and so treatment is recommended to prevent development of skin cancer.  Recommend daily broad spectrum sunscreen SPF 30+ to sun-exposed areas, reapply every 2 hours as needed.  Recommend staying in the shade or wearing long  sleeves, sun glasses (UVA+UVB protection) and wide brim hats (4-inch brim around the entire circumference of the hat). Call for new or changing lesions.   Destruction of lesion - L forhead x 2, R upper eyebrow x 1, L cheek x 3, L preauricular x 1 R cheek x 5, L forearm below elbow x 2  Destruction method: cryotherapy   Informed consent: discussed and consent obtained   Lesion destroyed using liquid nitrogen: Yes   Region frozen until ice ball extended beyond lesion: Yes   Outcome: patient tolerated procedure well with no complications   Post-procedure details: wound care instructions given   Additional details:  Prior to procedure, discussed risks of blister formation, small wound, skin dyspigmentation, or rare scar following cryotherapy. Recommend Vaseline ointment to treated areas while healing.    ACTINIC DAMAGE WITH PRECANCEROUS ACTINIC KERATOSES Counseling for Topical Chemotherapy Management: Patient exhibits: - Severe, confluent actinic changes with pre-cancerous actinic keratoses that is secondary to cumulative UV radiation exposure over time - Condition that is severe; chronic, not at goal. - diffuse scaly erythematous macules and papules with underlying dyspigmentation - Discussed Prescription "Field Treatment" topical Chemotherapy for Severe, Chronic Confluent Actinic Changes with Pre-Cancerous Actinic Keratoses Field treatment involves treatment of an entire area of skin that has confluent Actinic Changes (Sun/ Ultraviolet light damage) and PreCancerous Actinic Keratoses by method of PhotoDynamic Therapy (PDT) and/or prescription Topical Chemotherapy agents such as 5-fluorouracil, 5-fluorouracil/calcipotriene, and/or imiquimod.  The purpose is to decrease the number of clinically evident and subclinical PreCancerous lesions to prevent progression to development of skin cancer by chemically destroying early precancer changes that may or may not be visible.  It has been shown to reduce  the risk of developing skin cancer in the treated area. As a result of treatment, redness, scaling, crusting, and open sores may occur during treatment course. One or more than one of these methods may be used and may have to be used several times to control, suppress and eliminate the PreCancerous changes. Discussed treatment course, expected reaction, and possible side effects. - Recommend daily broad spectrum sunscreen SPF 30+ to sun-exposed areas, reapply every 2 hours as needed.  - Staying in the shade or wearing long sleeves, sun glasses (UVA+UVB protection) and wide brim hats (4-inch brim around the entire circumference of the hat) are also recommended. - Call for new or changing lesions.  -- Start 5-fluorouracil/calcipotriene cream twice a day for 5-7 days to affected areas including scaly spots face. Prescription sent to [facility]. Patient advised they will receive an email to purchase the medication online and have it sent to their home. Patient provided with handout reviewing treatment course and side effects and advised to call or message [place] on MyChart with any concerns.  Reviewed course of treatment and expected reaction.  Patient advised to expect inflammation and crusting and advised that erosions are possible.  Patient advised to be diligent with sun protection during and after treatment. Counseled to keep medication out of reach of children and pets.   SEBORRHEIC KERATOSIS - Stuck-on, waxy, tan-brown papules and/or plaques  - Benign-appearing - Discussed benign etiology and prognosis. - Observe - Call for any changes - R post shoulder, L leg  LENTIGINES Exam: scattered tan macules Due to sun exposure Treatment Plan: Benign-appearing, observe. Recommend daily broad spectrum sunscreen SPF 30+ to sun-exposed areas, reapply every 2 hours as needed.  Call for any changes   SCAR Exam: violaceous patch R knee, history of recent injury  Benign-appearing.   Observation.  Call clinic for new or changing lesions. Recommend daily broad spectrum sunscreen SPF 30+, reapply every 2 hours as needed. Treatment: Recommend Serica moisturizing scar formula cream every night or [facility] brand or Mederma silicone scar sheet every night for the first year after a scar appears to help with scar remodeling if desired. Scars remodel on their own for a full year and will gradually improve in appearance over time.   DERMATOFIBROMA Exam: Firm pink/brown papulenodule with dimple sign R med lower thigh  Treatment Plan: A dermatofibroma is a benign growth possibly related to trauma, such as an insect bite, cut from shaving, or inflamed acne-type bump.  Treatment options to remove include shave or excision with resulting scar and risk of recurrence.  Since benign-appearing and not bothersome, will observe for now.    Return in about 6 months (around 10/18/2023) for AK f/u.  I, Ardis Rowan, RMA, am acting as scribe for Willeen Niece, MD .   Documentation: I have reviewed the above documentation for accuracy and completeness, and I agree with the above.  Willeen Niece, MD

## 2023-04-18 NOTE — Patient Instructions (Addendum)
Cryotherapy Aftercare  Wash gently with soap and water everyday.   Apply Vaseline and Band-Aid daily until healed.   - Start 5-fluorouracil/calcipotriene cream twice a day for 5-7 days to affected areas including scaly spots face. Prescription sent to [facility]. Patient advised they will receive an email to purchase the medication online and have it sent to their home. Patient provided with handout reviewing treatment course and side effects and advised to call or message [place] on MyChart with any concerns.  Reviewed course of treatment and expected reaction.  Patient advised to expect inflammation and crusting and advised that erosions are possible.  Patient advised to be diligent with sun protection during and after treatment. Counseled to keep medication out of reach of children and pets.   Instructions for [facility]  One or more of your medications was sent to the [facility]. You will receive an email from them and can purchase the medicine through that link. It will then be mailed to your home at the address you confirmed. If for any reason you do not receive an email from them, please check your spam folder. If you still do not find the email, please let us know. [facility] phone number is [phone].   5-Fluorouracil/Calcipotriene Patient Education   Actinic keratoses are the dry, red scaly spots on the skin caused by sun damage. A portion of these spots can turn into skin cancer with time, and treating them can help prevent development of skin cancer.   Treatment of these spots requires removal of the defective skin cells. There are various ways to remove actinic keratoses, including freezing with liquid nitrogen, treatment with creams, or treatment with a blue light procedure in the office.   5-fluorouracil cream is a topical cream used to treat actinic keratoses. It works by interfering with the growth  of abnormal fast-growing skin cells, such as actinic keratoses. These cells peel off and are replaced by healthy ones.   5-fluorouracil/calcipotriene is a combination of the 5-fluorouracil cream with a vitamin D analog cream called calcipotriene. The calcipotriene alone does not treat actinic keratoses. However, when it is combined with 5-fluorouracil, it helps the 5-fluorouracil treat the actinic keratoses much faster so that the same results can be achieved with a much shorter treatment time.  INSTRUCTIONS FOR 5-FLUOROURACIL/CALCIPOTRIENE CREAM:   5-fluorouracil/calcipotriene cream typically only needs to be used for 4-7 days. A thin layer should be applied twice a day to the treatment areas recommended by your physician.   If your physician prescribed you separate tubes of 5-fluourouracil and calcipotriene, apply a thin layer of 5-fluorouracil followed by a thin layer of calcipotriene.   Avoid contact with your eyes, nostrils, and mouth. Do not use 5-fluorouracil/calcipotriene cream on infected or open wounds.   You will develop redness, irritation and some crusting at areas where you have pre-cancer damage/actinic keratoses. IF YOU DEVELOP PAIN, BLEEDING, OR SIGNIFICANT CRUSTING, STOP THE TREATMENT EARLY - you have already gotten a good response and the actinic keratoses should clear up well.  Wash your hands after applying 5-fluorouracil 5% cream on your skin.   A moisturizer or sunscreen with a minimum SPF 30 should be applied each morning.   Once you have finished the treatment, you can apply a thin layer of Vaseline twice a day to irritated areas to soothe and calm the areas more quickly. If you experience significant discomfort, contact your physician.  For some patients it is necessary to  repeat the treatment for best results.  SIDE EFFECTS: When using 5-fluorouracil/calcipotriene cream, you may have mild irritation, such as redness, dryness, swelling, or a mild burning sensation.  This usually resolves within 2 weeks. The more actinic keratoses you have, the more redness and inflammation you can expect during treatment. Eye irritation has been reported rarely. If this occurs, please let us know.  If you have any trouble using this cream, please call the office. If you have any other questions about this information, please do not hesitate to ask me before you leave the office.  Due to recent changes in healthcare laws, you may see results of your pathology and/or laboratory studies on MyChart before the doctors have had a chance to review them. We understand that in some cases there may be results that are confusing or concerning to you. Please understand that not all results are received at the same time and often the doctors may need to interpret multiple results in order to provide you with the best plan of care or course of treatment. Therefore, we ask that you please give [place] 2 business days to thoroughly review all your results before contacting the office for clarification. Should we see a critical lab result, you will be contacted sooner.   If You Need Anything After Your Visit  If you have any questions or concerns for your doctor, please call our main line at [phone] and press option 4 to reach your doctor's medical assistant. If no one answers, please leave a voicemail as directed and we will return your call as soon as possible. Messages left after 4 pm will be answered the following business day.   You may also send [place] a message via MyChart. We typically respond to MyChart messages within 1-2 business days.  For prescription refills, please ask your pharmacy to contact our office. Our fax number is [phone].  If you have an urgent issue when the clinic is closed that cannot wait until the next business day, you can page your doctor at the number below.    Please note that while we do our best to be available for urgent issues outside of office hours, we  are not available 24/7.   If you have an urgent issue and are unable to reach [place], you may choose to seek medical care at your doctor's office, retail clinic, urgent care center, or emergency room.  If you have a medical emergency, please immediately call 911 or go to the emergency department.  Pager Numbers  - Dr. Gwen Pounds: [phone]  - Dr. Neale Burly: [phone]  - Dr. Roseanne Reno: [phone]  In the event of inclement weather, please call our main line at [phone] for an update on the status of any delays or closures.  Dermatology Medication Tips: Please keep the boxes that topical medications come in in order to help keep track of the instructions about where and how to use these. Pharmacies typically print the medication instructions only on the boxes and not directly on the medication tubes.   If your medication is too expensive, please contact our office at [phone] option 4 or send [place] a message through MyChart.   We are unable to tell what your co-pay for medications will be in advance as this is different depending on your insurance coverage. However, we may be able to find a substitute medication at lower cost or fill out paperwork to get insurance to cover a needed medication.   If a prior authorization is required  to get your medication covered by your insurance company, please allow [place] 1-2 business days to complete this process.  Drug prices often vary depending on where the prescription is filled and some pharmacies may offer cheaper prices.  The website www.goodrx.com contains coupons for medications through different pharmacies. The prices here do not account for what the cost may be with help from insurance (it may be cheaper with your insurance), but the website can give you the price if you did not use any insurance.  - You can print the associated coupon and take it with your prescription to the pharmacy.  - You may also stop by our office during regular business  hours and pick up a GoodRx coupon card.  - If you need your prescription sent electronically to a different pharmacy, notify our office through [facility] or by phone at [phone] option 4.     Si Usted Necesita Algo Despus de Su Visita  Tambin puede enviarnos un mensaje a travs de Clinical cytogeneticist. Por lo general respondemos a los mensajes de MyChart en el transcurso de 1 a 2 das hbiles.  Para renovar recetas, por favor pida a su farmacia que se ponga en contacto con nuestra oficina. Annie Sable de fax es [place] [phone].  Si tiene un asunto urgente cuando la clnica est cerrada y que no puede esperar hasta el siguiente da hbil, puede llamar/localizar a su doctor(a) al nmero que aparece a continuacin.   Por favor, tenga en cuenta que aunque hacemos todo lo posible para estar disponibles para asuntos urgentes fuera del horario de [place], no estamos disponibles las 24 horas del da, los 7 [address]  [address] de la [place].   Si tiene un problema urgente y no puede comunicarse con nosotros, puede optar por buscar atencin mdica  en el consultorio de su doctor(a), en una clnica privada, en un centro de atencin urgente o en una sala de emergencias.  Si tiene Engineer, drilling, por favor llame inmediatamente al 911 o vaya a la sala de emergencias.  Nmeros de bper  - Dr. Gwen Pounds: [phone]  - Dra. Moye: [phone]  - Dra. Roseanne Reno: [phone]  En caso de inclemencias del [place], por favor llame a Lacy Duverney principal al [phone] para una actualizacin sobre el [place] de cualquier retraso o cierre.  Consejos para la medicacin en dermatologa: Por favor, guarde las cajas en las que vienen los medicamentos de uso tpico para ayudarle a seguir las instrucciones sobre dnde y cmo usarlos. Las farmacias generalmente imprimen las instrucciones del medicamento slo en las cajas y no directamente en los tubos del [place].   Si su medicamento es muy caro, por favor,  pngase en contacto con Rolm Gala llamando al [phone] y presione la opcin 4 o envenos un mensaje a travs de Clinical cytogeneticist.   No podemos decirle cul ser su copago por los medicamentos por adelantado ya que esto es diferente dependiendo de la cobertura de su seguro. Sin embargo, es posible que podamos encontrar un medicamento sustituto a Audiological scientist un formulario para que el seguro cubra el medicamento que se considera necesario.   Si se requiere una autorizacin previa para que su compaa de seguros [place] su medicamento, por favor permtanos de 1 a 2 das hbiles para completar [address]  Los precios de los medicamentos varan con frecuencia dependiendo del Environmental consultant de dnde se surte la receta y alguna farmacias pueden ofrecer precios ms baratos.  El sitio web www.goodrx.com tiene cupones para medicamentos de [employer] [facility]  farmacias. Los precios aqu no tienen en cuenta lo que podra costar con la ayuda del seguro (puede ser ms barato con su seguro), pero el sitio web puede darle el precio si no utiliz Tourist information centre manager.  - Puede imprimir el cupn correspondiente y llevarlo con su receta a la farmacia.  - Tambin puede pasar por nuestra oficina durante el horario de atencin regular y Education officer, museum una tarjeta de cupones de GoodRx.  - Si necesita que su receta se enve electrnicamente a una farmacia diferente, informe a nuestra oficina a travs de MyChart de [place] o por telfono llamando al [phone] y presione la opcin 4.

## 2023-06-22 ENCOUNTER — Other Ambulatory Visit: Payer: Self-pay | Admitting: Cardiovascular Disease

## 2023-08-16 ENCOUNTER — Ambulatory Visit: Payer: No Typology Code available for payment source | Admitting: Dermatology

## 2023-09-20 ENCOUNTER — Other Ambulatory Visit: Payer: Self-pay | Admitting: Cardiovascular Disease

## 2023-10-30 ENCOUNTER — Ambulatory Visit: Payer: No Typology Code available for payment source | Admitting: Dermatology

## 2023-11-28 ENCOUNTER — Encounter: Payer: Self-pay | Admitting: Dermatology

## 2023-11-28 ENCOUNTER — Ambulatory Visit: Payer: No Typology Code available for payment source | Admitting: Dermatology

## 2023-11-28 DIAGNOSIS — L578 Other skin changes due to chronic exposure to nonionizing radiation: Secondary | ICD-10-CM

## 2023-11-28 DIAGNOSIS — L821 Other seborrheic keratosis: Secondary | ICD-10-CM

## 2023-11-28 DIAGNOSIS — Z872 Personal history of diseases of the skin and subcutaneous tissue: Secondary | ICD-10-CM | POA: Diagnosis not present

## 2023-11-28 DIAGNOSIS — L570 Actinic keratosis: Secondary | ICD-10-CM | POA: Diagnosis not present

## 2023-11-28 DIAGNOSIS — W908XXA Exposure to other nonionizing radiation, initial encounter: Secondary | ICD-10-CM

## 2023-11-28 NOTE — Patient Instructions (Addendum)
 Cryotherapy Aftercare  Wash gently with soap and water everyday.   Apply Vaseline and Band-Aid daily until healed.   Okay to use 5FU/Calcipotriene twice daily 5-7 days to pink scaly lesions.    Recommend daily broad spectrum sunscreen SPF 30+ to sun-exposed areas, reapply every 2 hours as needed. Call for new or changing lesions.  Staying in the shade or wearing long sleeves, sun glasses (UVA+UVB protection) and wide brim hats (4-inch brim around the entire circumference of the hat) are also recommended for sun protection.      Due to recent changes in healthcare laws, you may see results of your pathology and/or laboratory studies on MyChart before the doctors have had a chance to review them. We understand that in some cases there may be results that are confusing or concerning to you. Please understand that not all results are received at the same time and often the doctors may need to interpret multiple results in order to provide you with the best plan of care or course of treatment. Therefore, we ask that you please give us  2 business days to thoroughly review all your results before contacting the office for clarification. Should we see a critical lab result, you will be contacted sooner.   If You Need Anything After Your Visit  If you have any questions or concerns for your doctor, please call our main line at [phone] and press option 4 to reach your doctor's medical assistant. If no one answers, please leave a voicemail as directed and we will return your call as soon as possible. Messages left after 4 pm will be answered the following business day.   You may also send us  a message via MyChart. We typically respond to MyChart messages within 1-2 business days.  For prescription refills, please ask your pharmacy to contact our office. Our fax number is [phone].  If you have an urgent issue when the clinic is closed that cannot wait until the next business day, you can page  your doctor at the number below.    Please note that while we do our best to be available for urgent issues outside of office hours, we are not available 24/7.   If you have an urgent issue and are unable to reach us , you may choose to seek medical care at your doctor's office, retail clinic, urgent care center, or emergency room.  If you have a medical emergency, please immediately call 911 or go to the emergency department.  Pager Numbers  - Dr. Bary Likes: [phone]  - Dr. Annette Barters: [phone]  - Dr. Felipe Horton: [phone]   In the event of inclement weather, please call our main line at [phone] for an update on the status of any delays or closures.  Dermatology Medication Tips: Please keep the boxes that topical medications come in in order to help keep track of the instructions about where and how to use these. Pharmacies typically print the medication instructions only on the boxes and not directly on the medication tubes.   If your medication is too expensive, please contact our office at [phone] option 4 or send us  a message through MyChart.   We are unable to tell what your co-pay for medications will be in advance as this is different depending on your insurance coverage. However, we may be able to find a substitute medication at lower cost or fill out paperwork to get insurance to cover a needed medication.   If a prior authorization is required to get your  medication covered by your insurance company, please allow us  1-2 business days to complete this process.  Drug prices often vary depending on where the prescription is filled and some pharmacies may offer cheaper prices.  The website www.goodrx.com contains coupons for medications through different pharmacies. The prices here do not account for what the cost may be with help from insurance (it may be cheaper with your insurance), but the website can give you the price if you did not use any insurance.  - You can  print the associated coupon and take it with your prescription to the pharmacy.  - You may also stop by our office during regular business hours and pick up a GoodRx coupon card.  - If you need your prescription sent electronically to a different pharmacy, notify our office through [facility] or by phone at [phone] option 4.     Si Usted Necesita Algo Despus de Su Visita  Tambin puede enviarnos un mensaje a travs de Clinical cytogeneticist. Por lo general respondemos a los mensajes de MyChart en el transcurso de 1 a 2 das hbiles.  Para renovar recetas, por favor pida a su farmacia que se ponga en contacto con nuestra oficina. Franz Jacks de fax es [place] [phone].  Si tiene un asunto urgente cuando la clnica est cerrada y que no puede esperar hasta el siguiente da hbil, puede llamar/localizar a su doctor(a) al nmero que aparece a continuacin.   Por favor, tenga en cuenta que aunque hacemos todo lo posible para estar disponibles para asuntos urgentes fuera del horario de [place], no estamos disponibles las 24 horas del da, los 7 [address]  [address] de la [place].   Si tiene un problema urgente y no puede comunicarse con nosotros, puede optar por buscar atencin mdica  en el consultorio de su doctor(a), en una clnica privada, en un centro de atencin urgente o en una sala de emergencias.  Si tiene Engineer, drilling, por favor llame inmediatamente al 911 o vaya a la sala de emergencias.  Nmeros de bper  - Dr. Bary Likes: [phone]  - Dra. Annette Barters: [phone]  - Dr. Felipe Horton: [phone]   En caso de inclemencias del tiempo, por favor llame a Lajuan Pila principal al [phone] para una actualizacin sobre el [place] de cualquier retraso o cierre.  Consejos para la medicacin en dermatologa: Por favor, guarde las cajas en las que vienen los medicamentos de uso tpico para ayudarle a seguir las instrucciones sobre dnde y cmo usarlos. Las farmacias generalmente imprimen las  instrucciones del medicamento slo en las cajas y no directamente en los tubos del [place].   Si su medicamento es muy caro, por favor, pngase en contacto con Bettyjane Brunet llamando al [phone] y presione la opcin 4 o envenos un mensaje a travs de Clinical cytogeneticist.   No podemos decirle cul ser su copago por los medicamentos por adelantado ya que esto es diferente dependiendo de la cobertura de su seguro. Sin embargo, es posible que podamos encontrar un medicamento sustituto a Audiological scientist un formulario para que el seguro cubra el medicamento que se considera necesario.   Si se requiere una autorizacin previa para que su compaa de seguros [place] su medicamento, por favor permtanos de 1 a 2 das hbiles para completar este proceso.  Los precios de los medicamentos varan con frecuencia dependiendo del Environmental consultant de dnde se surte la receta y alguna farmacias pueden ofrecer precios ms baratos.  El sitio web www.goodrx.com tiene cupones para medicamentos de Health and safety inspector. Los  precios aqu no tienen en cuenta lo que podra costar con la ayuda del seguro (puede ser ms barato con su seguro), pero el sitio web puede darle el precio si no utiliz Tourist information centre manager.  - Puede imprimir el cupn correspondiente y llevarlo con su receta a la farmacia.  - Tambin puede pasar por nuestra oficina durante el horario de atencin regular y Education officer, museum una tarjeta de cupones de GoodRx.  - Si necesita que su receta se enve electrnicamente a una farmacia diferente, informe a nuestra oficina a travs de MyChart de [place] o por telfono llamando al [phone] y presione la opcin 4.

## 2023-11-28 NOTE — Progress Notes (Signed)
 Follow-Up Visit   Subjective  Jeffrey Morales is a 53 y.o. male who presents for the following: 6 month AK follow up. Tx with LN2 04/18/2023. Face, L forearm. Used 5FU/Calcipotriene cream on entire face for 10 days since last visit. States was inflamed and painful but face is much smoother now.  The patient has spots, moles and lesions to be evaluated, some may be new or changing and the patient may have concern these could be cancer.   The following portions of the chart were reviewed this encounter and updated as appropriate: medications, allergies, medical history  Review of Systems:  No other skin or systemic complaints except as noted in HPI or Assessment and Plan.  Objective  Well appearing patient in no apparent distress; mood and affect are within normal limits.  A focused examination was performed of the following areas: Face, ears, neck, hands  Relevant exam findings are noted in the Assessment and Plan.  R upper forehead x1, L forehead x1, L temple x1 (3) Erythematous thin papules/macules with gritty scale.   Assessment & Plan   ACTINIC DAMAGE, with good result using 5FU/Calcipotriene cream to face - chronic, secondary to cumulative UV radiation exposure/sun exposure over time - diffuse scaly erythematous macules with underlying dyspigmentation - Recommend daily broad spectrum sunscreen SPF 30+ to sun-exposed areas, reapply every 2 hours as needed.  - Recommend staying in the shade or wearing long sleeves, sun glasses (UVA+UVB protection) and wide brim hats (4-inch brim around the entire circumference of the hat). - Call for new or changing lesions. Okay to spot treat 5FU/Calcipotriene twice daily 5-7 days to pink scaly lesions. May repeat field treatment in a year  SEBORRHEIC KERATOSIS - Stuck-on, waxy, tan-brown papules and/or plaques at left arm/elbow - Benign-appearing - Discussed benign etiology and prognosis. - Observe - Call for any changes  HISTORY OF  PRECANCEROUS ACTINIC KERATOSIS - site(s) of previously treated PreCancerous Actinic Keratosis clear today. - these may recur and new lesions may form requiring treatment to prevent transformation into skin cancer - observe for new or changing spots and contact [facility] for appointment if occur - photoprotection with sun protective clothing; sunglasses and broad spectrum sunscreen with SPF of at least 30 + and frequent self skin exams recommended - yearly exams by a dermatologist recommended for persons with history of PreCancerous Actinic Keratoses   AK (ACTINIC KERATOSIS) (3) R upper forehead x1, L forehead x1, L temple x1 (3) Actinic keratoses are precancerous spots that appear secondary to cumulative UV radiation exposure/sun exposure over time. They are chronic with expected duration over 1 year. A portion of actinic keratoses will progress to squamous cell carcinoma of the skin. It is not possible to reliably predict which spots will progress to skin cancer and so treatment is recommended to prevent development of skin cancer.  Recommend daily broad spectrum sunscreen SPF 30+ to sun-exposed areas, reapply every 2 hours as needed.  Recommend staying in the shade or wearing long sleeves, sun glasses (UVA+UVB protection) and wide brim hats (4-inch brim around the entire circumference of the hat). Call for new or changing lesions. Destruction of lesion - R upper forehead x1, L forehead x1, L temple x1 (3)  Destruction method: cryotherapy   Informed consent: discussed and consent obtained   Lesion destroyed using liquid nitrogen: Yes   Region frozen until ice ball extended beyond lesion: Yes   Outcome: patient tolerated procedure well with no complications   Post-procedure details: wound care instructions  given   Additional details:  Prior to procedure, discussed risks of blister formation, small wound, skin dyspigmentation, or rare scar following cryotherapy. Recommend Vaseline  ointment to treated areas while healing.   Return in about 1 year (around 11/27/2024) for AK Follow Up, UBSE.  I, Darcie Easterly, CMA, am acting as scribe for Artemio Larry, MD.   Documentation: I have reviewed the above documentation for accuracy and completeness, and I agree with the above.  Artemio Larry, MD

## 2023-12-15 ENCOUNTER — Other Ambulatory Visit: Payer: Self-pay | Admitting: Cardiovascular Disease

## 2023-12-17 ENCOUNTER — Other Ambulatory Visit: Payer: Self-pay | Admitting: Cardiovascular Disease

## 2023-12-17 NOTE — Telephone Encounter (Signed)
Last office visit:  12/20/22 with plan to f/u in 12 months  Next office visit:  02/22/24

## 2023-12-17 NOTE — Telephone Encounter (Signed)
Hi,  Could you schedule this patient a 12 month follow up visit? The patient was last seen by Dr. Mariah Milling on 12-20-2022. Thank you so much.

## 2023-12-17 NOTE — Telephone Encounter (Signed)
Hi Lauren,  Will you please outreach patient to schedule OD 12 month follow up. LOV 12/20/2022.  Thank you,  Ferne Coe

## 2023-12-17 NOTE — Telephone Encounter (Signed)
 Pt scheduled on 4/11

## 2023-12-20 NOTE — Telephone Encounter (Signed)
 Last office visit:  12/20/22 with plan to f/u in 12 months  Next office visit:  02/22/24

## 2024-01-15 ENCOUNTER — Other Ambulatory Visit: Payer: Self-pay | Admitting: Cardiovascular Disease

## 2024-02-16 ENCOUNTER — Other Ambulatory Visit: Payer: Self-pay | Admitting: Cardiovascular Disease

## 2024-02-22 ENCOUNTER — Ambulatory Visit: Payer: No Typology Code available for payment source | Admitting: Cardiovascular Disease

## 2024-02-29 ENCOUNTER — Other Ambulatory Visit: Payer: Self-pay | Admitting: Cardiovascular Disease

## 2024-03-23 ENCOUNTER — Other Ambulatory Visit: Payer: Self-pay | Admitting: Cardiovascular Disease

## 2024-04-01 ENCOUNTER — Ambulatory Visit: Admitting: Cardiovascular Disease

## 2024-04-16 ENCOUNTER — Other Ambulatory Visit: Payer: Self-pay | Admitting: Cardiovascular Disease

## 2024-05-19 NOTE — Progress Notes (Unsigned)
 Evaluation Performed:  Follow-up visit  Date:  05/20/2024   ID:  Jeffrey Morales, DOB 07/07/1971, MRN [id number]  Patient Location:  [address] [place] [address]   Provider location:   [facility], [place] office  PCP:  Patient, No Pcp Per  Cardiologist:  Perla CRIS Nicolas   Chief Complaint  Patient presents with   12 month follow up     Doing well.     History of Present Illness:    Jeffrey Morales is a 53 y.o. male  past medical history of obesity,  elevated triglycerides,  history of tachycardia,  Previously seen at  [facility] with symptoms of dehydration, tachycardia . History of partial colectomy in the past, part of small bowel, part of large intestine previous difficulty maintaining hydration given previous GI surgery CT coronary calcium  score of 0 Echo 09/2015: normal, EF 60% hypertension  who presents For routine follow-up of his tachycardia and hypertension  Last seen by myself 2/24 Overall reports doing well, out of cholesterol medication Taking metoprolol  succinate 100 in Am, 75 in PM Tachycardia relatively well-controlled  Denies significant chest pain on exertion Busy at work, no regular exercise program, weight trending higher Weight on last clinic visit to 86, now running 297  Continues to work in hospital system in [place]  Covid December 2020 Covid aug 2022  Last set of labs early 2024  EKG personally reviewed by myself on todays visit EKG Interpretation Date/Time:  Tuesday May 20 2024 09:59:53 EDT Ventricular Rate:  78 PR Interval:  154 QRS Duration:  78 QT Interval:  360 QTC Calculation: 410 R Axis:   5  Text Interpretation: Normal sinus rhythm Normal ECG When compared with ECG of 10-Oct-2015 21:34, No significant change was found Confirmed by Perla Lye [phone]) on 05/20/2024 10:04:52 AM    Labs reviewed Lab Results  Component Value Date   CHOL 130 12/20/2022   HDL 47 12/20/2022   LDLCALC  59 12/20/2022   TRIG 137 12/20/2022    Past Medical History:  Diagnosis Date   Allergy    Basal cell carcinoma 02/07/2018   left lateral pectoral costal, EDC   Basal cell carcinoma (BCC) 02/09/2016   left costal/lat pectoral   Basal cell carcinoma (BCC) 02/09/2016   left proximal tricep at junction with deltoid   Bulging lumbar disc    L1-S1   Chronic diarrhea    Diverticulosis    Essential hypertension    GERD (gastroesophageal reflux disease)    Hyperlipidemia    PVC (premature ventricular contraction)    no problems currently   Reflux    Small bowel perforation (HCC)    in 2003, with abscess and inflammatory mass, path c/w Crohns   Past Surgical History:  Procedure Laterality Date   COLONOSCOPY     LAPAROSCOPIC ILEOCECECTOMY     MANDIBLE FRACTURE SURGERY     WISDOM TOOTH EXTRACTION        Allergies:   Clarithromycin and Penicillins   Social History   Tobacco Use   Smoking status: Never   Smokeless tobacco: Never  Vaping Use   Vaping status: Never Used  Substance Use Topics   Alcohol use: No    Alcohol/week: 0.0 standard drinks of alcohol   Drug use: No     Current Outpatient Medications on File Prior to Visit  Medication Sig Dispense Refill   Aspirin 81 MG CAPS Take 81 mg by mouth every other day.  b complex vitamins tablet Take 1 tablet by mouth daily.     famotidine  (PEPCID ) 20 MG tablet TAKE 1 TABLET BY MOUTH TWICE DAILY **PLEASE SCHEDULE A YEARLY FOLLOW UP. THANK YOU 60 tablet 0   fluorouracil  (EFUDEX ) 5 % cream Apply topically 2 (two) times daily. Bid to scaly spots face and upper lip for 5-7 days 30 g 1   hydrochlorothiazide  (HYDRODIURIL ) 25 MG tablet TAKE 1 TABLET BY MOUTH EVERY DAY AS NEEDED 30 tablet 0   loratadine  (CLARITIN ) 10 MG tablet Take 10 mg by mouth daily.     Magnesium  250 MG TABS Take 1 tablet by mouth daily.     metoprolol  succinate (TOPROL -XL) 100 MG 24 hr tablet TAKE 1.5 TABLETS BY MOUTH IN THE MORNING. TAKE WITH OR IMMEDIATELY  FOLLOWING A MEAL. 135 tablet 3   metoprolol  succinate (TOPROL -XL) 50 MG 24 hr tablet TAKE 1 TABLET BY MOUTH EVERY EVENING. TAKE WITH OR IMMEDIATELY FOLLOWING A MEAL. 90 tablet 3   metoprolol  tartrate (LOPRESSOR ) 25 MG tablet TAKE 1 TABLET BY MOUTH TWICE A DAY AS NEEDED FOR PALPITATIONS 180 tablet 0   Multiple Vitamins-Minerals (ZINC PO) Take by mouth daily.     POTASSIUM CHLORIDE  PO Take by mouth.     VITAMIN D  PO Take by mouth daily.     Choline Fenofibrate  (FENOFIBRIC ACID ) 135 MG CPDR Take 1 tablet by mouth daily. (Patient not taking: Reported on 05/20/2024) 90 capsule 3   methylcellulose (CITRUCEL) oral powder Take daily as directed     rosuvastatin  (CRESTOR ) 5 MG tablet Take 1 tablet (5 mg total) by mouth daily. (Patient not taking: Reported on 05/20/2024) 90 tablet 3   No current facility-administered medications on file prior to visit.     Family Hx: The patient's family history includes Hypertension in his father; Lymphoma in his father. There is no history of Colon cancer, Esophageal cancer, Rectal cancer, or Stomach cancer.  ROS:   Please see the history of present illness.    Review of Systems  Constitutional: Negative.   HENT: Negative.    Respiratory: Negative.    Cardiovascular: Negative.   Gastrointestinal: Negative.   Musculoskeletal: Negative.   Neurological: Negative.   Psychiatric/Behavioral: Negative.    All other systems reviewed and are negative.    Labs/Other Tests and Data Reviewed:    Recent Labs: No results found for requested labs within last 365 days.   Recent Lipid Panel Lab Results  Component Value Date/Time   CHOL 130 12/20/2022 10:22 AM   TRIG 137 12/20/2022 10:22 AM   HDL 47 12/20/2022 10:22 AM   CHOLHDL 2.8 12/20/2022 10:22 AM   CHOLHDL 4.3 11/17/2016 01:04 PM   LDLCALC 59 12/20/2022 10:22 AM    Wt Readings from Last 3 Encounters:  05/20/24 297 lb (134.7 kg)  12/20/22 286 lb 2 oz (129.8 kg)  10/05/21 297 lb 4 oz (134.8 kg)     Exam:     BP (!) 140/90 (BP Location: Left Arm, Patient Position: Sitting, Cuff Size: Large)   Pulse 78   Ht 5' 11 (1.803 m)   Wt 297 lb (134.7 kg)   SpO2 97%   BMI 41.42 kg/m  Constitutional:  oriented to person, place, and time. No distress.  HENT:  Head: Grossly normal Eyes:  no discharge. No scleral icterus.  Neck: No JVD, no carotid bruits  Cardiovascular: Regular rate and rhythm, no murmurs appreciated Pulmonary/Chest: Clear to auscultation bilaterally, no wheezes or rales Abdominal: Soft.  no distension.  no tenderness.  Musculoskeletal: Normal range of motion Neurological:  normal muscle tone. Coordination normal. No atrophy Skin: Skin warm and dry Psychiatric: normal affect, pleasant   ASSESSMENT & PLAN:    Problem List Items Addressed This Visit     Sinus tachycardia - Primary   Relevant Orders   EKG 12-Lead (Completed)   Palpitations   Relevant Orders   EKG 12-Lead (Completed)   Other Visit Diagnoses       Essential hypertension       Relevant Medications   Aspirin 81 MG CAPS   Other Relevant Orders   EKG 12-Lead (Completed)     Shortness of breath         Mixed hyperlipidemia       Relevant Medications   Aspirin 81 MG CAPS      Essential hypertension Blood pressure borderline elevated, recommend close monitoring at home  S/p colectomy Stable   Sinus tachycardia  on metoprolol  succinate 100 in the morning, 75 mg in the evening, tachycardia well-controlled   Class 1 obesity due to excess calories without serious comorbidity with body mass index (BMI) of 30.0 to 30.9 We have encouraged continued exercise, careful diet management in an effort to lose weight.  Hyperlipidemia Ran out of Crestor  Refill provided for Crestor  5 mg daily Coronary calcium  score of 0 , in 2017 Lipid panel ordered  S/p covid: In 2020   Signed, Evalene Lunger, MD  [facility] [address], [place], [place]  [address]

## 2024-05-20 ENCOUNTER — Encounter: Payer: Self-pay | Admitting: Cardiovascular Disease

## 2024-05-20 ENCOUNTER — Ambulatory Visit: Attending: Cardiovascular Disease | Admitting: Cardiovascular Disease

## 2024-05-20 VITALS — BP 140/90 | HR 78 | Ht 71.0 in | Wt 297.0 lb

## 2024-05-20 DIAGNOSIS — R0602 Shortness of breath: Secondary | ICD-10-CM

## 2024-05-20 DIAGNOSIS — R002 Palpitations: Secondary | ICD-10-CM

## 2024-05-20 DIAGNOSIS — R000 Tachycardia, unspecified: Secondary | ICD-10-CM | POA: Diagnosis not present

## 2024-05-20 DIAGNOSIS — I10 Essential (primary) hypertension: Secondary | ICD-10-CM

## 2024-05-20 DIAGNOSIS — Z79899 Other long term (current) drug therapy: Secondary | ICD-10-CM

## 2024-05-20 DIAGNOSIS — E782 Mixed hyperlipidemia: Secondary | ICD-10-CM

## 2024-05-20 MED ORDER — METOPROLOL SUCCINATE ER 50 MG PO TB24: ORAL_TABLET | ORAL | 3 refills | Status: AC

## 2024-05-20 MED ORDER — ROSUVASTATIN CALCIUM 5 MG PO TABS: 5.0000 mg | ORAL_TABLET | Freq: Every day | ORAL | 3 refills | Status: AC

## 2024-05-20 MED ORDER — METOPROLOL SUCCINATE ER 100 MG PO TB24: ORAL_TABLET | ORAL | 3 refills | Status: AC

## 2024-05-20 NOTE — Patient Instructions (Addendum)
 Medication Instructions:   No changes  If you need a refill on your cardiac medications before your next appointment, please call your pharmacy.   Lab work:  A1C, CMP, Lipids, TSH, CBC  Testing/Procedures: No new testing needed  Follow-Up: At [facility], you and your health needs are our priority.  As part of our continuing mission to provide you with exceptional heart care, we have created designated Provider Care Teams.  These Care Teams include your primary Cardiologist (physician) and Advanced Practice Providers (APPs -  Physician Assistants and Nurse Practitioners) who all work together to provide you with the care you need, when you need it.  You will need a follow up appointment in 12 months  Providers on your designated Care Team:   Lonni Meager, NP Bernardino Bring, PA-C Cadence [place], [place]  COVID-19 Vaccine Information can be found at: [email or web address] For questions related to vaccine distribution or appointments, please email [email or web address] or call [phone].

## 2024-05-21 LAB — TSH: TSH: 0.836 u[IU]/mL (ref 0.450–4.500)

## 2024-05-21 LAB — CBC
Hematocrit: 45.6 % (ref 37.5–51.0)
Hemoglobin: 15.3 g/dL (ref 13.0–17.7)
MCH: 29.1 pg (ref 26.6–33.0)
MCHC: 33.6 g/dL (ref 31.5–35.7)
MCV: 87 fL (ref 79–97)
Platelets: 294 x10E3/uL (ref 150–450)
RBC: 5.25 x10E6/uL (ref 4.14–5.80)
RDW: 14 % (ref 11.6–15.4)
WBC: 6.7 x10E3/uL (ref 3.4–10.8)

## 2024-05-21 LAB — COMPREHENSIVE METABOLIC PANEL WITH GFR
ALT: 25 IU/L (ref 0–44)
AST: 25 IU/L (ref 0–40)
Albumin: 4.5 g/dL (ref 3.8–4.9)
Alkaline Phosphatase: 78 IU/L (ref 44–121)
BUN/Creatinine Ratio: 13 (ref 9–20)
BUN: 11 mg/dL (ref 6–24)
Bilirubin Total: 0.4 mg/dL (ref 0.0–1.2)
CO2: 21 mmol/L (ref 20–29)
Calcium: 9.3 mg/dL (ref 8.7–10.2)
Chloride: 98 mmol/L (ref 96–106)
Creatinine, Ser: 0.84 mg/dL (ref 0.76–1.27)
Globulin, Total: 2.5 g/dL (ref 1.5–4.5)
Glucose: 92 mg/dL (ref 70–99)
Potassium: 4.7 mmol/L (ref 3.5–5.2)
Sodium: 137 mmol/L (ref 134–144)
Total Protein: 7 g/dL (ref 6.0–8.5)
eGFR: 105 mL/min/1.73 (ref >59)

## 2024-05-21 LAB — LIPID PANEL
Chol/HDL Ratio: 5.2 ratio — ABNORMAL HIGH (ref 0.0–5.0)
Cholesterol, Total: 207 mg/dL — ABNORMAL HIGH (ref 100–199)
HDL: 40 mg/dL (ref >39)
LDL Chol Calc (NIH): 81 mg/dL (ref 0–99)
Triglycerides: 540 mg/dL — ABNORMAL HIGH (ref 0–149)
VLDL Cholesterol Cal: 86 mg/dL — ABNORMAL HIGH (ref 5–40)

## 2024-05-21 LAB — HEMOGLOBIN A1C
Est. average glucose Bld gHb Est-mCnc: 105 mg/dL
Hgb A1c MFr Bld: 5.3 % (ref 4.8–5.6)

## 2024-05-25 ENCOUNTER — Ambulatory Visit: Payer: Self-pay | Admitting: Cardiovascular Disease

## 2024-05-25 DIAGNOSIS — Z79899 Other long term (current) drug therapy: Secondary | ICD-10-CM

## 2024-05-25 DIAGNOSIS — E782 Mixed hyperlipidemia: Secondary | ICD-10-CM

## 2024-07-20 ENCOUNTER — Other Ambulatory Visit: Payer: Self-pay | Admitting: Cardiovascular Disease

## 2024-12-05 ENCOUNTER — Encounter: Payer: Self-pay | Admitting: Cardiovascular Disease

## 2024-12-08 ENCOUNTER — Encounter: Payer: No Typology Code available for payment source | Admitting: Dermatology

## 2024-12-09 ENCOUNTER — Other Ambulatory Visit: Payer: Self-pay | Admitting: Cardiovascular Disease

## 2024-12-09 MED ORDER — SEMAGLUTIDE-WEIGHT MANAGEMENT 1.7 MG/0.75ML ~~LOC~~ SOAJ: 1.7000 mg | SUBCUTANEOUS | 0 refills | Status: AC

## 2024-12-09 MED ORDER — SEMAGLUTIDE-WEIGHT MANAGEMENT 0.25 MG/0.5ML ~~LOC~~ SOAJ: 0.2500 mg | SUBCUTANEOUS | 0 refills | Status: AC

## 2024-12-09 MED ORDER — SEMAGLUTIDE-WEIGHT MANAGEMENT 2.4 MG/0.75ML ~~LOC~~ SOAJ: 2.4000 mg | SUBCUTANEOUS | 0 refills | Status: AC

## 2024-12-09 MED ORDER — SEMAGLUTIDE-WEIGHT MANAGEMENT 0.5 MG/0.5ML ~~LOC~~ SOAJ: 0.5000 mg | SUBCUTANEOUS | 0 refills | Status: AC

## 2024-12-09 MED ORDER — SEMAGLUTIDE-WEIGHT MANAGEMENT 1 MG/0.5ML ~~LOC~~ SOAJ: 1.0000 mg | SUBCUTANEOUS | 0 refills | Status: AC

## 2024-12-10 NOTE — Progress Notes (Signed)
 Canceled appointment, pt not seen

## 2024-12-18 ENCOUNTER — Telehealth: Payer: Self-pay | Admitting: Pharmacy Technician

## 2024-12-18 ENCOUNTER — Other Ambulatory Visit (HOSPITAL_COMMUNITY): Payer: Self-pay

## 2024-12-18 NOTE — Telephone Encounter (Signed)
 Pharmacy Patient Advocate Encounter   Received notification from Physician's Office that prior authorization for wegovy  is required/requested.   Insurance verification completed.   The patient is insured through [facility].   Per test claim: PA required; PA submitted to above mentioned insurance via Latent Key/confirmation #/[id number] Status is pending

## 2025-01-26 ENCOUNTER — Ambulatory Visit: Admitting: Dermatology
